# Patient Record
Sex: Female | Born: 1955 | Race: White | Hispanic: No | Smoking: Current every day smoker
Health system: Southern US, Community
[De-identification: ages and names within clinical notes are randomized; demographics above are authoritative.]

## PROBLEM LIST (undated history)

## (undated) DIAGNOSIS — J449 Chronic obstructive pulmonary disease, unspecified: Secondary | ICD-10-CM

## (undated) DIAGNOSIS — J45909 Unspecified asthma, uncomplicated: Secondary | ICD-10-CM

## (undated) DIAGNOSIS — R42 Dizziness and giddiness: Secondary | ICD-10-CM

## (undated) DIAGNOSIS — I10 Essential (primary) hypertension: Secondary | ICD-10-CM

## (undated) DIAGNOSIS — K219 Gastro-esophageal reflux disease without esophagitis: Secondary | ICD-10-CM

## (undated) DIAGNOSIS — F32A Depression, unspecified: Secondary | ICD-10-CM

## (undated) DIAGNOSIS — F419 Anxiety disorder, unspecified: Secondary | ICD-10-CM

## (undated) DIAGNOSIS — F329 Major depressive disorder, single episode, unspecified: Secondary | ICD-10-CM

## (undated) HISTORY — PX: FOOT SURGERY: SHX648

## (undated) HISTORY — PX: ABDOMINAL HYSTERECTOMY: SHX81

---

## 1998-07-17 ENCOUNTER — Inpatient Hospital Stay (HOSPITAL_COMMUNITY): Admission: AD | Admit: 1998-07-17 | Discharge: 1998-07-21 | Payer: Self-pay | Admitting: *Deleted

## 1999-06-05 ENCOUNTER — Inpatient Hospital Stay (HOSPITAL_COMMUNITY): Admission: EM | Admit: 1999-06-05 | Discharge: 1999-06-08 | Payer: Self-pay

## 2004-03-12 ENCOUNTER — Emergency Department (HOSPITAL_COMMUNITY): Admission: EM | Admit: 2004-03-12 | Discharge: 2004-03-12 | Payer: Self-pay | Admitting: Emergency Medicine

## 2004-09-12 ENCOUNTER — Ambulatory Visit: Payer: Self-pay | Admitting: Psychiatry

## 2004-09-12 ENCOUNTER — Ambulatory Visit: Payer: Self-pay | Admitting: Family Medicine

## 2004-09-19 ENCOUNTER — Ambulatory Visit: Payer: Self-pay | Admitting: Family Medicine

## 2004-09-27 ENCOUNTER — Ambulatory Visit: Payer: Self-pay | Admitting: Family Medicine

## 2004-10-28 ENCOUNTER — Ambulatory Visit: Payer: Self-pay | Admitting: Family Medicine

## 2005-01-22 ENCOUNTER — Emergency Department (HOSPITAL_COMMUNITY): Admission: EM | Admit: 2005-01-22 | Discharge: 2005-01-22 | Payer: Self-pay | Admitting: Emergency Medicine

## 2007-09-10 ENCOUNTER — Emergency Department (HOSPITAL_COMMUNITY): Admission: EM | Admit: 2007-09-10 | Discharge: 2007-09-10 | Payer: Self-pay | Admitting: Emergency Medicine

## 2008-04-21 ENCOUNTER — Ambulatory Visit (HOSPITAL_COMMUNITY): Admission: RE | Admit: 2008-04-21 | Discharge: 2008-04-21 | Payer: Self-pay | Admitting: Family Medicine

## 2010-09-13 ENCOUNTER — Other Ambulatory Visit (HOSPITAL_COMMUNITY): Payer: Self-pay | Admitting: Family Medicine

## 2010-09-13 DIAGNOSIS — Z139 Encounter for screening, unspecified: Secondary | ICD-10-CM

## 2010-09-15 ENCOUNTER — Ambulatory Visit (HOSPITAL_COMMUNITY)
Admission: RE | Admit: 2010-09-15 | Discharge: 2010-09-15 | Disposition: A | Discharge status: Home / Self Care | Payer: PRIVATE HEALTH INSURANCE | Source: Ambulatory Visit | Attending: Family Medicine | Admitting: Family Medicine

## 2010-09-15 DIAGNOSIS — Z139 Encounter for screening, unspecified: Secondary | ICD-10-CM

## 2010-09-15 DIAGNOSIS — Z1231 Encounter for screening mammogram for malignant neoplasm of breast: Secondary | ICD-10-CM | POA: Insufficient documentation

## 2011-02-28 LAB — URINE MICROSCOPIC-ADD ON

## 2011-02-28 LAB — URINALYSIS, ROUTINE W REFLEX MICROSCOPIC
Bilirubin Urine: NEGATIVE
Hgb urine dipstick: NEGATIVE
Ketones, ur: NEGATIVE
Protein, ur: NEGATIVE
Specific Gravity, Urine: 1.005
Urobilinogen, UA: 0.2

## 2011-02-28 LAB — GC/CHLAMYDIA PROBE AMP, GENITAL: Chlamydia, DNA Probe: NEGATIVE

## 2011-02-28 LAB — URINE CULTURE
Colony Count: NO GROWTH
Culture: NO GROWTH

## 2011-02-28 LAB — WET PREP, GENITAL: Yeast Wet Prep HPF POC: NONE SEEN

## 2011-05-10 ENCOUNTER — Ambulatory Visit (HOSPITAL_COMMUNITY): Admission: RE | Admit: 2011-05-10 | Payer: BC Managed Care – PPO | Source: Ambulatory Visit

## 2011-05-10 ENCOUNTER — Emergency Department (HOSPITAL_COMMUNITY)
Admission: EM | Admit: 2011-05-10 | Discharge: 2011-05-10 | Disposition: A | Discharge status: Home / Self Care | Payer: BC Managed Care – PPO | Attending: Emergency Medicine | Admitting: Emergency Medicine

## 2011-05-10 ENCOUNTER — Encounter: Payer: Self-pay | Admitting: *Deleted

## 2011-05-10 DIAGNOSIS — F17200 Nicotine dependence, unspecified, uncomplicated: Secondary | ICD-10-CM | POA: Insufficient documentation

## 2011-05-10 DIAGNOSIS — K219 Gastro-esophageal reflux disease without esophagitis: Secondary | ICD-10-CM | POA: Insufficient documentation

## 2011-05-10 DIAGNOSIS — I10 Essential (primary) hypertension: Secondary | ICD-10-CM | POA: Insufficient documentation

## 2011-05-10 DIAGNOSIS — J40 Bronchitis, not specified as acute or chronic: Secondary | ICD-10-CM | POA: Insufficient documentation

## 2011-05-10 HISTORY — DX: Gastro-esophageal reflux disease without esophagitis: K21.9

## 2011-05-10 HISTORY — DX: Essential (primary) hypertension: I10

## 2011-05-10 MED ORDER — AZITHROMYCIN 250 MG PO TABS: 250.0000 mg | ORAL_TABLET | Freq: Every day | ORAL | Status: AC

## 2011-05-10 MED ORDER — AZITHROMYCIN 250 MG PO TABS
500.0000 mg | ORAL_TABLET | Freq: Once | ORAL | Status: DC
  Filled 2011-05-10: qty 2

## 2011-05-10 NOTE — ED Notes (Signed)
MD at bedside to evaluate. Into room to evaluate. Resting sitting up in bed. States she had had cough\ congestion x 13 days. States it started out in sinuses and has moved down into lungs. Lung sounds clear in all fields with no respiratory difficulties. nonlabored breathing.

## 2011-05-10 NOTE — ED Notes (Signed)
Pt reports onset of productive cough and asso uri s&s starting 1 week ago

## 2011-05-10 NOTE — ED Provider Notes (Signed)
History     CSN: [id number] Arrival date & time: 05/10/2011  3:47 AM   First MD Initiated Contact with Patient 05/10/11 [phone]      Chief Complaint  Patient presents with  . Cough    (Consider location/radiation/quality/duration/timing/severity/associated sxs/prior treatment) Patient is a 55 y.o. female presenting with cough. The history is provided by the patient.  Cough This is a new problem. The current episode started more than 1 week ago. The problem occurs constantly. The problem has been gradually worsening. The cough is productive of sputum. There has been no fever. Associated symptoms include chills. Pertinent negatives include no chest pain, no sweats, no headaches and no shortness of breath. She has tried decongestants for the symptoms. She is a smoker.    Past Medical History  Diagnosis Date  . Hypertension   . Acid reflux     Past Surgical History  Procedure Date  . Abdominal hysterectomy     No family history on file.  History  Substance Use Topics  . Smoking status: Current Some Day Smoker  . Smokeless tobacco: Not on file  . Alcohol Use: No    OB History    Grav Para Term Preterm Abortions TAB SAB Ect Mult Living                  Review of Systems  Constitutional: Positive for chills.  Respiratory: Positive for cough. Negative for shortness of breath.   Cardiovascular: Negative for chest pain.  Neurological: Negative for headaches.  All other systems reviewed and are negative.    Allergies  Review of patient's allergies indicates no known allergies.  Home Medications  No current outpatient prescriptions on file.  BP 106/64  Pulse 78  Temp(Src) 97.9 F (36.6 C) (Oral)  Resp 20  Ht 5\' 3"  (1.6 m)  Wt 130 lb (58.968 kg)  BMI 23.03 kg/m2  SpO2 99%  Physical Exam  Nursing note and vitals reviewed. Constitutional: She is oriented to person, place, and time. She appears well-developed and well-nourished. No distress.  HENT:  Head:  Normocephalic and atraumatic.  Neck: Normal range of motion. Neck supple.  Cardiovascular: Normal rate and regular rhythm.  Exam reveals no gallop and no friction rub.   No murmur heard. Pulmonary/Chest: Effort normal and breath sounds normal. No respiratory distress. She has no wheezes.  Abdominal: Soft. Bowel sounds are normal. She exhibits no distension. There is no tenderness.  Musculoskeletal: Normal range of motion.  Neurological: She is alert and oriented to person, place, and time.  Skin: Skin is warm and dry. She is not diaphoretic.    ED Course  Procedures (including critical care time)  Labs Reviewed - No data to display No results found.   No diagnosis found.    MDM  Sick for two weeks.  Not getting better with otc meds.  Will give zithromax in addition to the albuterol inhaler she already has.        Geoffery Lyons, MD 05/10/11 [phone]

## 2012-01-25 ENCOUNTER — Emergency Department (HOSPITAL_COMMUNITY): Payer: BC Managed Care – PPO

## 2012-01-25 ENCOUNTER — Encounter (HOSPITAL_COMMUNITY): Payer: Self-pay

## 2012-01-25 ENCOUNTER — Emergency Department (HOSPITAL_COMMUNITY)
Admission: EM | Admit: 2012-01-25 | Discharge: 2012-01-25 | Disposition: A | Discharge status: Home / Self Care | Payer: BC Managed Care – PPO | Attending: Emergency Medicine | Admitting: Emergency Medicine

## 2012-01-25 DIAGNOSIS — F17200 Nicotine dependence, unspecified, uncomplicated: Secondary | ICD-10-CM | POA: Insufficient documentation

## 2012-01-25 DIAGNOSIS — I10 Essential (primary) hypertension: Secondary | ICD-10-CM | POA: Insufficient documentation

## 2012-01-25 DIAGNOSIS — K219 Gastro-esophageal reflux disease without esophagitis: Secondary | ICD-10-CM | POA: Insufficient documentation

## 2012-01-25 DIAGNOSIS — J0110 Acute frontal sinusitis, unspecified: Secondary | ICD-10-CM | POA: Insufficient documentation

## 2012-01-25 MED ORDER — CETIRIZINE-PSEUDOEPHEDRINE ER 5-120 MG PO TB12: 1.0000 | ORAL_TABLET | Freq: Two times a day (BID) | ORAL | Status: AC

## 2012-01-25 MED ORDER — AMOXICILLIN 500 MG PO CAPS: 500.0000 mg | ORAL_CAPSULE | Freq: Three times a day (TID) | ORAL | Status: AC

## 2012-01-25 MED ORDER — HYDROCODONE-ACETAMINOPHEN 5-325 MG PO TABS: 1.0000 | ORAL_TABLET | ORAL | Status: AC | PRN

## 2012-01-25 NOTE — ED Notes (Signed)
Complain of fever and congestion

## 2012-01-25 NOTE — ED Notes (Signed)
Patient began w/chest congestion Sunday progressing to sinus congestion and fullness in ears.  Reports Tmax at home this week was 101. Reports expectorating thick brown phlegm and green sinus drainage.

## 2012-01-25 NOTE — ED Notes (Signed)
Patient with no complaints at this time. Respirations even and unlabored. Skin warm/dry. Discharge instructions reviewed with patient at this time. Patient given opportunity to voice concerns/ask questions. Patient discharged at this time and left Emergency Department with steady gait.   

## 2012-01-30 NOTE — ED Provider Notes (Signed)
Medical screening examination/treatment/procedure(s) were performed by non-physician practitioner and as supervising physician I was immediately available for consultation/collaboration.   Rhylan Gross III, MD 01/30/12 1621 

## 2012-01-30 NOTE — ED Provider Notes (Signed)
History     CSN: [id number]  Arrival date & time 01/25/12  1139   First MD Initiated Contact with Patient 01/25/12 1220      Chief Complaint  Patient presents with  . Fever    (Consider location/radiation/quality/duration/timing/severity/associated sxs/prior treatment) HPI Comments: Penny Manning presents with a 4 days history of cough, nasal congestion and post nasal drip which has progessed to now include facial pain and pressure along with thick brown and green nasal congestion and post nasal drip.  She has had a fever to 101 at home which has responded to tylenol.  She has had a productive cough also,  But denies shortness of breath.  She has increasing fatigue and frontal headache.  She has found no alleviators for nasal congestion and cough.  She denies chest pain, sore throat, nausea and emesis.  The history is provided by the patient.    Past Medical History  Diagnosis Date  . Hypertension   . Acid reflux     Past Surgical History  Procedure Date  . Abdominal hysterectomy     No family history on file.  History  Substance Use Topics  . Smoking status: Current Some Day Smoker  . Smokeless tobacco: Not on file  . Alcohol Use: No    OB History    Grav Para Term Preterm Abortions TAB SAB Ect Mult Living                  Review of Systems  Constitutional: Negative for fever.  HENT: Positive for ear pain, congestion, rhinorrhea, postnasal drip and sinus pressure. Negative for sore throat, facial swelling, neck pain and neck stiffness.   Eyes: Negative.   Respiratory: Positive for cough. Negative for chest tightness and shortness of breath.   Cardiovascular: Negative for chest pain.  Gastrointestinal: Negative for nausea and abdominal pain.  Genitourinary: Negative.   Musculoskeletal: Negative for joint swelling and arthralgias.  Skin: Negative.  Negative for rash and wound.  Neurological: Positive for headaches. Negative for dizziness, weakness,  light-headedness and numbness.  Hematological: Negative.   Psychiatric/Behavioral: Negative.     Allergies  Review of patient's allergies indicates no known allergies.  Home Medications   Current Outpatient Rx  Name Route Sig Dispense Refill  . LISINOPRIL-HYDROCHLOROTHIAZIDE 20-12.5 MG PO TABS Oral Take 1 tablet by mouth daily.    Marland Kitchen OMEPRAZOLE 20 MG PO CPDR Oral Take 20 mg by mouth daily.    . AMOXICILLIN 500 MG PO CAPS Oral Take 1 capsule (500 mg total) by mouth 3 (three) times daily. 30 capsule 0  . CETIRIZINE-PSEUDOEPHEDRINE ER 5-120 MG PO TB12 Oral Take 1 tablet by mouth 2 (two) times daily. 20 tablet 0  . HYDROCODONE-ACETAMINOPHEN 5-325 MG PO TABS Oral Take 1 tablet by mouth every 4 (four) hours as needed for pain. 15 tablet 0    BP 150/97  Pulse 84  Temp 98.3 F (36.8 C) (Oral)  Resp 16  Ht 5\' 3"  (1.6 m)  Wt 130 lb (58.968 kg)  BMI 23.03 kg/m2  SpO2 95%  Physical Exam  Constitutional: She is oriented to person, place, and time. She appears well-developed and well-nourished.  HENT:  Head: Normocephalic and atraumatic.  Right Ear: Tympanic membrane and ear canal normal.  Left Ear: Tympanic membrane and ear canal normal.  Nose: Mucosal edema and rhinorrhea present. Left sinus exhibits frontal sinus tenderness.  Mouth/Throat: Uvula is midline, oropharynx is clear and moist and mucous membranes are normal. No oropharyngeal exudate, posterior  oropharyngeal edema, posterior oropharyngeal erythema or tonsillar abscesses.  Eyes: Conjunctivae are normal.  Pulmonary/Chest: Effort normal. No respiratory distress. She has no wheezes. She has rhonchi in the right lower field and the left lower field. She has no rales.  Abdominal: Soft. There is no tenderness.  Musculoskeletal: Normal range of motion.  Neurological: She is alert and oriented to person, place, and time.  Skin: Skin is warm and dry. No rash noted.  Psychiatric: She has a normal mood and affect.    ED Course    Procedures (including critical care time)  Labs Reviewed - No data to display No results found.   1. Sinusitis, acute frontal       MDM  CXR reviewed and clear.  Pt's sinusitis tx with amoxil,  Also prescribed zyrtec d and hydrocodone for facial pain and for cough suppression.  Encouraged recheck by pcp in 1 week,  Sooner for worsened sx.        Burgess Amor, PA 01/30/12 1248

## 2013-03-07 ENCOUNTER — Other Ambulatory Visit (HOSPITAL_COMMUNITY): Payer: Self-pay | Admitting: Internal Medicine

## 2013-03-07 ENCOUNTER — Ambulatory Visit (HOSPITAL_COMMUNITY)
Admission: RE | Admit: 2013-03-07 | Discharge: 2013-03-07 | Disposition: A | Discharge status: Home / Self Care | Payer: BC Managed Care – PPO | Source: Ambulatory Visit | Attending: Internal Medicine | Admitting: Internal Medicine

## 2013-03-07 DIAGNOSIS — R059 Cough, unspecified: Secondary | ICD-10-CM | POA: Insufficient documentation

## 2013-03-07 DIAGNOSIS — R05 Cough: Secondary | ICD-10-CM

## 2015-02-17 ENCOUNTER — Emergency Department (HOSPITAL_COMMUNITY)
Admission: EM | Admit: 2015-02-17 | Discharge: 2015-02-17 | Disposition: A | Discharge status: Home / Self Care | Payer: Worker's Compensation | Attending: Emergency Medicine | Admitting: Emergency Medicine

## 2015-02-17 ENCOUNTER — Encounter (HOSPITAL_COMMUNITY): Payer: Self-pay | Admitting: *Deleted

## 2015-02-17 DIAGNOSIS — Y93E5 Activity, floor mopping and cleaning: Secondary | ICD-10-CM | POA: Insufficient documentation

## 2015-02-17 DIAGNOSIS — Y9289 Other specified places as the place of occurrence of the external cause: Secondary | ICD-10-CM | POA: Insufficient documentation

## 2015-02-17 DIAGNOSIS — S61213A Laceration without foreign body of left middle finger without damage to nail, initial encounter: Secondary | ICD-10-CM | POA: Insufficient documentation

## 2015-02-17 DIAGNOSIS — F419 Anxiety disorder, unspecified: Secondary | ICD-10-CM | POA: Diagnosis not present

## 2015-02-17 DIAGNOSIS — S61412A Laceration without foreign body of left hand, initial encounter: Secondary | ICD-10-CM | POA: Diagnosis not present

## 2015-02-17 DIAGNOSIS — Z79899 Other long term (current) drug therapy: Secondary | ICD-10-CM | POA: Insufficient documentation

## 2015-02-17 DIAGNOSIS — I10 Essential (primary) hypertension: Secondary | ICD-10-CM | POA: Diagnosis not present

## 2015-02-17 DIAGNOSIS — Z7951 Long term (current) use of inhaled steroids: Secondary | ICD-10-CM | POA: Insufficient documentation

## 2015-02-17 DIAGNOSIS — K219 Gastro-esophageal reflux disease without esophagitis: Secondary | ICD-10-CM | POA: Insufficient documentation

## 2015-02-17 DIAGNOSIS — J449 Chronic obstructive pulmonary disease, unspecified: Secondary | ICD-10-CM | POA: Diagnosis not present

## 2015-02-17 DIAGNOSIS — Z23 Encounter for immunization: Secondary | ICD-10-CM | POA: Diagnosis not present

## 2015-02-17 DIAGNOSIS — Y998 Other external cause status: Secondary | ICD-10-CM | POA: Insufficient documentation

## 2015-02-17 DIAGNOSIS — Z87891 Personal history of nicotine dependence: Secondary | ICD-10-CM | POA: Diagnosis not present

## 2015-02-17 DIAGNOSIS — W25XXXA Contact with sharp glass, initial encounter: Secondary | ICD-10-CM | POA: Diagnosis not present

## 2015-02-17 HISTORY — DX: Chronic obstructive pulmonary disease, unspecified: J44.9

## 2015-02-17 HISTORY — DX: Anxiety disorder, unspecified: F41.9

## 2015-02-17 MED ORDER — TETANUS-DIPHTH-ACELL PERTUSSIS 5-2.5-18.5 LF-MCG/0.5 IM SUSP
0.5000 mL | Freq: Once | INTRAMUSCULAR | Status: AC
  Administered 2015-02-17: 0.5 mL via INTRAMUSCULAR
  Filled 2015-02-17: qty 0.5

## 2015-02-17 MED ORDER — CEPHALEXIN 500 MG PO CAPS: 500.0000 mg | ORAL_CAPSULE | Freq: Four times a day (QID) | ORAL | Status: DC

## 2015-02-17 MED ORDER — CEPHALEXIN 500 MG PO CAPS
500.0000 mg | ORAL_CAPSULE | Freq: Once | ORAL | Status: AC
  Administered 2015-02-17: 500 mg via ORAL
  Filled 2015-02-17: qty 1

## 2015-02-17 MED ORDER — POVIDONE-IODINE 10 % EX SOLN
CUTANEOUS | Status: AC
  Administered 2015-02-17: 23:00:00
  Filled 2015-02-17: qty 118

## 2015-02-17 MED ORDER — LIDOCAINE HCL (PF) 1 % IJ SOLN
INTRAMUSCULAR | Status: AC
  Administered 2015-02-17
  Filled 2015-02-17: qty 5

## 2015-02-17 NOTE — ED Notes (Signed)
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

## 2015-02-17 NOTE — ED Provider Notes (Signed)
CSN: [id number]     Arrival date & time 02/17/15  2129 History   First MD Initiated Contact with Patient 02/17/15 2207     Chief Complaint  Patient presents with  . Extremity Laceration     (Consider location/radiation/quality/duration/timing/severity/associated sxs/prior Treatment) HPI   Penny Manning is a 59 y.o. female who presents to the Emergency Department complaining of lacerations to her left hand that occurred this evening.  This is a work related injury.  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  Less than one hour prior to ED arrival, she states she cut her left middle finger performing the same job. She complains of "numb tingling feeling" to thumb side of the finger.  Last Td is unknown.  She denies swelling, difficulty bending or flexing the finger, possible foreign bodies or use of anticoagulants.      Past Medical History  Diagnosis Date  . Hypertension   . Acid reflux   . COPD (chronic obstructive pulmonary disease)   . Anxiety    Past Surgical History  Procedure Laterality Date  . Abdominal hysterectomy     History reviewed. No pertinent family history. Social History  Substance Use Topics  . Smoking status: Former Smoker    Quit date: 02/10/2015  . Smokeless tobacco: None  . Alcohol Use: No   OB History    No data available     Review of Systems  Constitutional: Negative for fever and chills.  Musculoskeletal: Negative for back pain, joint swelling and arthralgias.  Skin: Positive for wound.       Laceration left palm and middle finger  Neurological: Negative for dizziness, weakness and numbness.  Hematological: Does not bruise/bleed easily.  All other systems reviewed and are negative.     Allergies  Review of patient's allergies indicates no known allergies.  Home Medications   Prior to Admission  medications   Medication Sig Start Date End Date Taking? Authorizing Provider  ALPRAZolam Prudy Feeler) 0.5 MG tablet Take 1 tablet by mouth 2 (two) times daily as needed for anxiety.  01/21/15  Yes Historical Provider, MD  lisinopril-hydrochlorothiazide (PRINZIDE,ZESTORETIC) 20-12.5 MG per tablet Take 1 tablet by mouth daily.   Yes Historical Provider, MD  meclizine (ANTIVERT) 25 MG tablet Take 1 tablet by mouth 4 (four) times daily as needed for dizziness.  02/12/15  Yes Historical Provider, MD  Multiple Vitamins-Minerals (AIRBORNE PO) Take by mouth daily.   Yes Historical Provider, MD  naproxen sodium (ALEVE) 220 MG tablet Take 220 mg by mouth daily as needed (for pain).   Yes Historical Provider, MD  omeprazole (PRILOSEC) 20 MG capsule Take 20 mg by mouth daily.   Yes Historical Provider, MD  pantoprazole (PROTONIX) 40 MG tablet Take 1 tablet by mouth 2 (two) times daily. 01/11/15  Yes Historical Provider, MD  PROAIR HFA 108 (90 BASE) MCG/ACT inhaler Inhale 1-2 puffs into the lungs every 6 (six) hours as needed for wheezing.  01/21/15  Yes Historical Provider, MD  sertraline (ZOLOFT) 100 MG tablet Take 100 mg by mouth every evening. 01/11/15  Yes Historical Provider, MD  SYMBICORT 160-4.5 MCG/ACT inhaler Inhale 2 puffs into the lungs 2 (two) times daily. 01/10/15  Yes Historical Provider, MD  zolpidem (AMBIEN) 10 MG tablet Take 10 mg by mouth at bedtime. 02/02/15  Yes Historical Provider, MD  BP 138/85 mmHg  Pulse 67  Temp(Src) 98.2 F (36.8 C) (Oral)  Resp 18  Ht  (1.6 m)  Wt 128 lb (58.06 kg)  BMI 22.68 kg/m2  SpO2 95% Physical Exam  Constitutional: She is oriented to person, place, and time. She appears well-developed and well-nourished. No distress.  HENT:  Head: Normocephalic and atraumatic.  Cardiovascular: Normal rate, regular rhythm, normal heart sounds and intact distal pulses.   No murmur heard. Pulmonary/Chest: Effort normal and breath sounds normal. No respiratory distress.    Musculoskeletal: She exhibits no edema or tenderness.       Left hand: She exhibits laceration. She exhibits normal range of motion, no bony tenderness, normal two-point discrimination, normal capillary refill, no deformity and no swelling. Normal strength noted. She exhibits no finger abduction and no thumb/finger opposition.       Hands: 2 cm lac the thumb side of the left middle finger.  1 cm lac to the mid palm.  Bleeding controlled, no edema.  Pt has full ROM of the finger.  CR< 2 sec.  Distal sensation intact.  Wound explored, no FB's or obvious injuries of the deep structures.    Neurological: She is alert and oriented to person, place, and time. She exhibits normal muscle tone. Coordination normal.  Skin: Skin is warm. No laceration noted.  Nursing note and vitals reviewed.   ED Course  Procedures (including critical care time) Labs Review Labs Reviewed - No data to display  Imaging Review No results found. I have personally reviewed and evaluated these images and lab results as part of my medical decision-making.   EKG Interpretation None       LACERATION REPAIR #1 Performed by: Simora Dingee L. Authorized by: Maxwell Caul Consent: Verbal consent obtained. Risks and benefits: risks, benefits and alternatives were discussed Consent given by: patient Patient identity confirmed: provided demographic data Prepped and Draped in normal sterile fashion Wound explored  Laceration Location: proximal left middle finger  Laceration Length: 2 cm  No Foreign Bodies seen or palpated  Anesthesia: local infiltration  Local anesthetic: lidocaine 1% w/o epinephrine  Anesthetic total: 2 ml  Irrigation method: syringe Amount of cleaning: standard  Skin closure: 4-0 ethilon  Number of sutures: 4  Technique: simple interrupted   LACERATION REPAIR #2  Performed by: Parth Mccormac L. Authorized by: Maxwell Caul Consent: Verbal consent obtained. Risks and  benefits: risks, benefits and alternatives were discussed Consent given by: patient Patient identity confirmed: provided demographic data Prepped and Draped in normal sterile fashion Wound explored  Laceration Location: left palm  Laceration Length: 1 cm  No Foreign Bodies seen or palpated  Anesthesia: local infiltration  Local anesthetic: lidocaine 1 % w/o epinephrine  Anesthetic total: 0.5 ml  Irrigation method: syringe Amount of cleaning: standard  Skin closure: 4-0 ethilon  Number of sutures: 2  Technique: simple interrupted  Patient tolerance: Patient tolerated the procedure well with no immediate complications.     MDM   Final diagnoses:  Laceration of left hand, initial encounter  Laceration of left middle finger w/o foreign body w/o damage to nail, initial encounter    Wounds were cleaned and bandaged, finger splinted.  Td updated.  Remains NV intact.  She complains of a numb tingling sensation to the middle finger, but gross sensation and ROM are intact.  I will prescribe keflex and have pt return here in 2 days for recheck or sooner if needed.  She was given proper wound care instructions and agrees  to plan.  Vitals stable.  Appears stable for d/c    Pauline Aus, PA-C 02/17/15 2336  Eber Hong, MD 02/18/15 2156

## 2015-02-17 NOTE — ED Notes (Signed)
Patient verbalizes understanding of discharge instructions, prescription medications, home care, and follow up care. Discharged and escorted to lab for workmans comp drug screen, family escorted patient.

## 2015-02-17 NOTE — Discharge Instructions (Signed)

## 2015-02-19 ENCOUNTER — Encounter (HOSPITAL_COMMUNITY): Payer: Self-pay | Admitting: Emergency Medicine

## 2015-02-19 ENCOUNTER — Emergency Department (HOSPITAL_COMMUNITY)
Admission: EM | Admit: 2015-02-19 | Discharge: 2015-02-19 | Disposition: A | Discharge status: Home / Self Care | Payer: Worker's Compensation | Attending: Emergency Medicine | Admitting: Emergency Medicine

## 2015-02-19 DIAGNOSIS — I10 Essential (primary) hypertension: Secondary | ICD-10-CM | POA: Insufficient documentation

## 2015-02-19 DIAGNOSIS — R200 Anesthesia of skin: Secondary | ICD-10-CM | POA: Diagnosis not present

## 2015-02-19 DIAGNOSIS — J449 Chronic obstructive pulmonary disease, unspecified: Secondary | ICD-10-CM | POA: Insufficient documentation

## 2015-02-19 DIAGNOSIS — K219 Gastro-esophageal reflux disease without esophagitis: Secondary | ICD-10-CM | POA: Diagnosis not present

## 2015-02-19 DIAGNOSIS — G629 Polyneuropathy, unspecified: Secondary | ICD-10-CM | POA: Diagnosis not present

## 2015-02-19 DIAGNOSIS — Z79899 Other long term (current) drug therapy: Secondary | ICD-10-CM | POA: Insufficient documentation

## 2015-02-19 DIAGNOSIS — F419 Anxiety disorder, unspecified: Secondary | ICD-10-CM | POA: Insufficient documentation

## 2015-02-19 DIAGNOSIS — Z7951 Long term (current) use of inhaled steroids: Secondary | ICD-10-CM | POA: Diagnosis not present

## 2015-02-19 DIAGNOSIS — Z87891 Personal history of nicotine dependence: Secondary | ICD-10-CM | POA: Insufficient documentation

## 2015-02-19 DIAGNOSIS — Z4801 Encounter for change or removal of surgical wound dressing: Secondary | ICD-10-CM | POA: Diagnosis not present

## 2015-02-19 DIAGNOSIS — IMO0002 Reserved for concepts with insufficient information to code with codable children: Secondary | ICD-10-CM

## 2015-02-19 NOTE — ED Notes (Signed)
Applied nonadherent telfa to suture sites and secured in place with paper tape. Pt tolerated well. nad noted.

## 2015-02-19 NOTE — Discharge Instructions (Signed)
As discussed,  The numbness in your finger may resolve over time, but can take several months.  Sometimes it does not improve, but it is encouraging that you increased sensation from just 2 days ago.

## 2015-02-19 NOTE — ED Notes (Signed)
Pt reports was told to come back for recheck of sutures. Pt reports had tetanus shot and sutures placed on 02/17/15.

## 2015-02-22 NOTE — ED Provider Notes (Signed)
CSN: [id number]     Arrival date & time 02/19/15  1117 History   First MD Initiated Contact with Patient 02/19/15 1256     Chief Complaint  Patient presents with  . Wound Check     (Consider location/radiation/quality/duration/timing/severity/associated sxs/prior Treatment) The history is provided by the patient.   Penny Manning is a 59 y.o. female presenting for laceration recheck.  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  She reported a numb feeling to the thumb side of her long finger distal to the laceration which is slightly better today, but still does not feel back to normal.  She has movement of the finger and can feel the distal tip and ulnar side.  She denies any problems with the sutured wound including swelling, drainage or worsening pain.     Past Medical History  Diagnosis Date  . Hypertension   . Acid reflux   . COPD (chronic obstructive pulmonary disease)   . Anxiety    Past Surgical History  Procedure Laterality Date  . Abdominal hysterectomy     History reviewed. No pertinent family history. Social History  Substance Use Topics  . Smoking status: Former Smoker    Quit date: 02/10/2015  . Smokeless tobacco: None  . Alcohol Use: No   OB History    No data available     Review of Systems  Constitutional: Negative for fever and chills.  Respiratory: Negative for shortness of breath and wheezing.   Skin: Positive for wound. Negative for color change.  Neurological: Positive for numbness.      Allergies  Review of patient's allergies indicates no known allergies.  Home Medications   Prior to Admission medications   Medication Sig Start Date End Date Taking? Authorizing Provider  ALPRAZolam Prudy Feeler) 0.5 MG tablet Take 1 tablet by mouth 2 (two) times daily as needed for anxiety.  01/21/15   Historical Provider, MD  cephALEXin (KEFLEX) 500 MG capsule Take 1 capsule (500 mg total) by mouth 4  (four) times daily. For 7 days 02/17/15   Tammy Triplett, PA-C  lisinopril-hydrochlorothiazide (PRINZIDE,ZESTORETIC) 20-12.5 MG per tablet Take 1 tablet by mouth daily.    Historical Provider, MD  meclizine (ANTIVERT) 25 MG tablet Take 1 tablet by mouth 4 (four) times daily as needed for dizziness.  02/12/15   Historical Provider, MD  Multiple Vitamins-Minerals (AIRBORNE PO) Take by mouth daily.    Historical Provider, MD  naproxen sodium (ALEVE) 220 MG tablet Take 220 mg by mouth daily as needed (for pain).    Historical Provider, MD  omeprazole (PRILOSEC) 20 MG capsule Take 20 mg by mouth daily.    Historical Provider, MD  pantoprazole (PROTONIX) 40 MG tablet Take 1 tablet by mouth 2 (two) times daily. 01/11/15   Historical Provider, MD  PROAIR HFA 108 (90 BASE) MCG/ACT inhaler Inhale 1-2 puffs into the lungs every 6 (six) hours as needed for wheezing.  01/21/15   Historical Provider, MD  sertraline (ZOLOFT) 100 MG tablet Take 100 mg by mouth every evening. 01/11/15   Historical Provider, MD  SYMBICORT 160-4.5 MCG/ACT inhaler Inhale 2 puffs into the lungs 2 (two) times daily. 01/10/15   Historical Provider, MD  zolpidem (AMBIEN) 10 MG tablet Take 10 mg by mouth at bedtime. 02/02/15   Historical Provider, MD   BP 123/71 mmHg  Pulse 55  Temp(Src) 98.1 F (36.7 C) (Oral)  Resp 18  Ht  (  1.6 m)  Wt 125 lb (56.7 kg)  BMI 22.15 kg/m2  SpO2 97% Physical Exam  Constitutional: She is oriented to person, place, and time. She appears well-developed and well-nourished.  HENT:  Head: Normocephalic.  Cardiovascular: Normal rate.   Pulmonary/Chest: Effort normal.  Musculoskeletal: She exhibits tenderness.  Neurological: She is alert and oriented to person, place, and time. No sensory deficit.  Skin: Laceration noted.  Well appearing lacerations left long finger and smaller wound palm.  Edges approximated, no drainage, swelling or erythema.  Distal sensation thumb side of long finger reduced in comparison  to the ulnar side, but present.  Distal cap refill less than 2 seconds.    ED Course  Procedures (including critical care time) Labs Review Labs Reviewed - No data to display  Imaging Review No results found. I have personally reviewed and evaluated these images and lab results as part of my medical decision-making.   EKG Interpretation None      MDM   Final diagnoses:  Laceration  Neuropathy    Advised patient that since she has noticed improvement in just 2 days, encouraging that her sensation may return completely but can take months for this to occur.  Also there is no guarantee this will be the case as it can continue to improve or not.  Pt referred to a hand specialist for further evaluation and tx if she decides she would like to consider surgical intervention to increase the likelihood of complete resolution of this neuropathy.    Burgess Amor, PA-C 02/22/15 1203  Rolland Porter, MD 02/24/15 [phone]

## 2015-02-24 ENCOUNTER — Encounter (HOSPITAL_COMMUNITY)
Admission: RE | Admit: 2015-02-24 | Discharge: 2015-02-24 | Disposition: A | Discharge status: Home / Self Care | Payer: Worker's Compensation | Source: Ambulatory Visit | Attending: Orthopedic Surgery | Admitting: Orthopedic Surgery

## 2015-02-24 ENCOUNTER — Encounter (HOSPITAL_BASED_OUTPATIENT_CLINIC_OR_DEPARTMENT_OTHER): Payer: Self-pay | Admitting: *Deleted

## 2015-02-24 ENCOUNTER — Other Ambulatory Visit: Payer: Self-pay | Admitting: Orthopedic Surgery

## 2015-02-24 DIAGNOSIS — F17210 Nicotine dependence, cigarettes, uncomplicated: Secondary | ICD-10-CM | POA: Diagnosis not present

## 2015-02-24 DIAGNOSIS — I10 Essential (primary) hypertension: Secondary | ICD-10-CM | POA: Diagnosis not present

## 2015-02-24 DIAGNOSIS — J449 Chronic obstructive pulmonary disease, unspecified: Secondary | ICD-10-CM | POA: Diagnosis not present

## 2015-02-24 DIAGNOSIS — S64493A Injury of digital nerve of left middle finger, initial encounter: Secondary | ICD-10-CM | POA: Diagnosis not present

## 2015-02-24 DIAGNOSIS — J45909 Unspecified asthma, uncomplicated: Secondary | ICD-10-CM | POA: Diagnosis not present

## 2015-02-24 DIAGNOSIS — Y9269 Other specified industrial and construction area as the place of occurrence of the external cause: Secondary | ICD-10-CM | POA: Diagnosis not present

## 2015-02-24 DIAGNOSIS — S6402XA Injury of ulnar nerve at wrist and hand level of left arm, initial encounter: Secondary | ICD-10-CM | POA: Diagnosis not present

## 2015-02-24 DIAGNOSIS — F329 Major depressive disorder, single episode, unspecified: Secondary | ICD-10-CM | POA: Diagnosis not present

## 2015-02-24 DIAGNOSIS — S61412A Laceration without foreign body of left hand, initial encounter: Secondary | ICD-10-CM | POA: Diagnosis present

## 2015-02-24 DIAGNOSIS — Z90710 Acquired absence of both cervix and uterus: Secondary | ICD-10-CM | POA: Diagnosis not present

## 2015-02-24 DIAGNOSIS — F419 Anxiety disorder, unspecified: Secondary | ICD-10-CM | POA: Diagnosis not present

## 2015-02-24 DIAGNOSIS — S65012A Laceration of ulnar artery at wrist and hand level of left arm, initial encounter: Secondary | ICD-10-CM | POA: Diagnosis not present

## 2015-02-24 DIAGNOSIS — Y9389 Activity, other specified: Secondary | ICD-10-CM | POA: Diagnosis not present

## 2015-02-24 DIAGNOSIS — Y990 Civilian activity done for income or pay: Secondary | ICD-10-CM | POA: Diagnosis not present

## 2015-02-24 DIAGNOSIS — W25XXXA Contact with sharp glass, initial encounter: Secondary | ICD-10-CM | POA: Diagnosis not present

## 2015-02-24 DIAGNOSIS — S66127A Laceration of flexor muscle, fascia and tendon of left little finger at wrist and hand level, initial encounter: Secondary | ICD-10-CM | POA: Diagnosis not present

## 2015-02-24 DIAGNOSIS — K219 Gastro-esophageal reflux disease without esophagitis: Secondary | ICD-10-CM | POA: Diagnosis not present

## 2015-02-24 LAB — BASIC METABOLIC PANEL
ANION GAP: 6 (ref 5–15)
BUN: 29 mg/dL — AB (ref 6–20)
CHLORIDE: 102 mmol/L (ref 101–111)
CO2: 28 mmol/L (ref 22–32)
Calcium: 8.8 mg/dL — ABNORMAL LOW (ref 8.9–10.3)
Creatinine, Ser: 0.96 mg/dL (ref 0.44–1.00)
GFR calc Af Amer: >60 mL/min (ref >60)
GFR calc non Af Amer: >60 mL/min (ref >60)
GLUCOSE: 98 mg/dL (ref 65–99)
POTASSIUM: 3.8 mmol/L (ref 3.5–5.1)
Sodium: 136 mmol/L (ref 135–145)

## 2015-02-25 ENCOUNTER — Encounter (HOSPITAL_BASED_OUTPATIENT_CLINIC_OR_DEPARTMENT_OTHER)
Admission: RE | Disposition: A | Discharge status: Home / Self Care | Payer: Self-pay | Source: Ambulatory Visit | Attending: Orthopedic Surgery

## 2015-02-25 ENCOUNTER — Ambulatory Visit (HOSPITAL_BASED_OUTPATIENT_CLINIC_OR_DEPARTMENT_OTHER): Payer: Worker's Compensation | Admitting: Anesthesiology

## 2015-02-25 ENCOUNTER — Ambulatory Visit (HOSPITAL_BASED_OUTPATIENT_CLINIC_OR_DEPARTMENT_OTHER)
Admission: RE | Admit: 2015-02-25 | Discharge: 2015-02-25 | Disposition: A | Discharge status: Home / Self Care | Payer: Worker's Compensation | Source: Ambulatory Visit | Attending: Orthopedic Surgery | Admitting: Orthopedic Surgery

## 2015-02-25 ENCOUNTER — Encounter (HOSPITAL_BASED_OUTPATIENT_CLINIC_OR_DEPARTMENT_OTHER): Payer: Self-pay | Admitting: *Deleted

## 2015-02-25 DIAGNOSIS — I10 Essential (primary) hypertension: Secondary | ICD-10-CM | POA: Insufficient documentation

## 2015-02-25 DIAGNOSIS — S64493A Injury of digital nerve of left middle finger, initial encounter: Secondary | ICD-10-CM | POA: Diagnosis not present

## 2015-02-25 DIAGNOSIS — F329 Major depressive disorder, single episode, unspecified: Secondary | ICD-10-CM | POA: Insufficient documentation

## 2015-02-25 DIAGNOSIS — J45909 Unspecified asthma, uncomplicated: Secondary | ICD-10-CM | POA: Insufficient documentation

## 2015-02-25 DIAGNOSIS — F17210 Nicotine dependence, cigarettes, uncomplicated: Secondary | ICD-10-CM | POA: Insufficient documentation

## 2015-02-25 DIAGNOSIS — S66127A Laceration of flexor muscle, fascia and tendon of left little finger at wrist and hand level, initial encounter: Secondary | ICD-10-CM | POA: Insufficient documentation

## 2015-02-25 DIAGNOSIS — Y990 Civilian activity done for income or pay: Secondary | ICD-10-CM | POA: Insufficient documentation

## 2015-02-25 DIAGNOSIS — S65012A Laceration of ulnar artery at wrist and hand level of left arm, initial encounter: Secondary | ICD-10-CM | POA: Insufficient documentation

## 2015-02-25 DIAGNOSIS — W25XXXA Contact with sharp glass, initial encounter: Secondary | ICD-10-CM | POA: Insufficient documentation

## 2015-02-25 DIAGNOSIS — J449 Chronic obstructive pulmonary disease, unspecified: Secondary | ICD-10-CM | POA: Insufficient documentation

## 2015-02-25 DIAGNOSIS — S61412A Laceration without foreign body of left hand, initial encounter: Secondary | ICD-10-CM | POA: Diagnosis not present

## 2015-02-25 DIAGNOSIS — K219 Gastro-esophageal reflux disease without esophagitis: Secondary | ICD-10-CM | POA: Insufficient documentation

## 2015-02-25 DIAGNOSIS — S6402XA Injury of ulnar nerve at wrist and hand level of left arm, initial encounter: Secondary | ICD-10-CM | POA: Diagnosis not present

## 2015-02-25 DIAGNOSIS — F419 Anxiety disorder, unspecified: Secondary | ICD-10-CM | POA: Insufficient documentation

## 2015-02-25 DIAGNOSIS — Y9389 Activity, other specified: Secondary | ICD-10-CM | POA: Insufficient documentation

## 2015-02-25 DIAGNOSIS — Y9269 Other specified industrial and construction area as the place of occurrence of the external cause: Secondary | ICD-10-CM | POA: Insufficient documentation

## 2015-02-25 DIAGNOSIS — Z90710 Acquired absence of both cervix and uterus: Secondary | ICD-10-CM | POA: Insufficient documentation

## 2015-02-25 HISTORY — DX: Unspecified asthma, uncomplicated: J45.909

## 2015-02-25 HISTORY — DX: Depression, unspecified: F32.A

## 2015-02-25 HISTORY — DX: Dizziness and giddiness: R42

## 2015-02-25 HISTORY — PX: NERVE, TENDON AND ARTERY REPAIR: SHX5695

## 2015-02-25 HISTORY — DX: Major depressive disorder, single episode, unspecified: F32.9

## 2015-02-25 SURGERY — NERVE, TENDON AND ARTERY REPAIR
Anesthesia: General | Site: Hand | Laterality: Left

## 2015-02-25 MED ORDER — HEPARIN SODIUM (PORCINE) 1000 UNIT/ML IJ SOLN
INTRAMUSCULAR | Status: AC
Start: 2015-02-25 — End: 2015-02-25
  Filled 2015-02-25: qty 1

## 2015-02-25 MED ORDER — FENTANYL CITRATE (PF) 100 MCG/2ML IJ SOLN
INTRAMUSCULAR | Status: AC
  Filled 2015-02-25: qty 4

## 2015-02-25 MED ORDER — OXYCODONE HCL 5 MG/5ML PO SOLN: 5.0000 mg | Freq: Once | ORAL | Status: DC | PRN

## 2015-02-25 MED ORDER — OXYCODONE HCL 5 MG PO TABS: 5.0000 mg | ORAL_TABLET | Freq: Once | ORAL | Status: DC | PRN

## 2015-02-25 MED ORDER — OXYCODONE-ACETAMINOPHEN 5-325 MG PO TABS: ORAL_TABLET | ORAL | Status: DC

## 2015-02-25 MED ORDER — ONDANSETRON HCL 4 MG/2ML IJ SOLN
INTRAMUSCULAR | Status: DC | PRN
  Administered 2015-02-25: 4 mg via INTRAVENOUS

## 2015-02-25 MED ORDER — FENTANYL CITRATE (PF) 100 MCG/2ML IJ SOLN
INTRAMUSCULAR | Status: AC
  Filled 2015-02-25: qty 2

## 2015-02-25 MED ORDER — GLYCOPYRROLATE 0.2 MG/ML IJ SOLN: 0.2000 mg | Freq: Once | INTRAMUSCULAR | Status: DC | PRN

## 2015-02-25 MED ORDER — MIDAZOLAM HCL 2 MG/2ML IJ SOLN
1.0000 mg | INTRAMUSCULAR | Status: DC | PRN
  Administered 2015-02-25: 2 mg via INTRAVENOUS

## 2015-02-25 MED ORDER — FENTANYL CITRATE (PF) 100 MCG/2ML IJ SOLN
50.0000 ug | INTRAMUSCULAR | Status: DC | PRN
  Administered 2015-02-25: 100 ug via INTRAVENOUS

## 2015-02-25 MED ORDER — LACTATED RINGERS IV SOLN
INTRAVENOUS | Status: DC
  Administered 2015-02-25 (×2): via INTRAVENOUS

## 2015-02-25 MED ORDER — PROPOFOL 10 MG/ML IV BOLUS
INTRAVENOUS | Status: DC | PRN
  Administered 2015-02-25: 200 mg via INTRAVENOUS

## 2015-02-25 MED ORDER — MIDAZOLAM HCL 2 MG/2ML IJ SOLN
INTRAMUSCULAR | Status: AC
  Filled 2015-02-25: qty 4

## 2015-02-25 MED ORDER — EPHEDRINE SULFATE 50 MG/ML IJ SOLN
INTRAMUSCULAR | Status: DC | PRN
  Administered 2015-02-25: 15 mg via INTRAVENOUS

## 2015-02-25 MED ORDER — CEFAZOLIN SODIUM-DEXTROSE 2-3 GM-% IV SOLR
2.0000 g | INTRAVENOUS | Status: AC
Start: 2015-02-26 — End: 2015-02-25
  Administered 2015-02-25: 2 g via INTRAVENOUS

## 2015-02-25 MED ORDER — SCOPOLAMINE 1 MG/3DAYS TD PT72: 1.0000 | MEDICATED_PATCH | Freq: Once | TRANSDERMAL | Status: DC | PRN

## 2015-02-25 MED ORDER — CEFAZOLIN SODIUM-DEXTROSE 2-3 GM-% IV SOLR
INTRAVENOUS | Status: AC
  Filled 2015-02-25: qty 50

## 2015-02-25 MED ORDER — CHLORHEXIDINE GLUCONATE 4 % EX LIQD: 60.0000 mL | Freq: Once | CUTANEOUS | Status: DC

## 2015-02-25 MED ORDER — BUPIVACAINE HCL (PF) 0.25 % IJ SOLN
INTRAMUSCULAR | Status: DC | PRN
  Administered 2015-02-25: 10 mL

## 2015-02-25 MED ORDER — LIDOCAINE HCL (PF) 1 % IJ SOLN
INTRAMUSCULAR | Status: AC
  Filled 2015-02-25: qty 30

## 2015-02-25 MED ORDER — PROMETHAZINE HCL 25 MG/ML IJ SOLN: 6.2500 mg | INTRAMUSCULAR | Status: DC | PRN

## 2015-02-25 MED ORDER — FENTANYL CITRATE (PF) 100 MCG/2ML IJ SOLN
25.0000 ug | INTRAMUSCULAR | Status: DC | PRN
  Administered 2015-02-25: 50 ug via INTRAVENOUS
  Administered 2015-02-25: 25 ug via INTRAVENOUS

## 2015-02-25 MED ORDER — LIDOCAINE HCL (PF) 1 % IJ SOLN
INTRAVENOUS | Status: DC | PRN
  Administered 2015-02-25: 2 mL

## 2015-02-25 MED ORDER — DEXAMETHASONE SODIUM PHOSPHATE 4 MG/ML IJ SOLN
INTRAMUSCULAR | Status: DC | PRN
  Administered 2015-02-25: 10 mg via INTRAVENOUS

## 2015-02-25 MED ORDER — LIDOCAINE HCL (CARDIAC) 20 MG/ML IV SOLN
INTRAVENOUS | Status: DC | PRN
  Administered 2015-02-25: 50 mg via INTRAVENOUS

## 2015-02-25 SURGICAL SUPPLY — 67 items
BAG DECANTER FOR FLEXI CONT (MISCELLANEOUS) ×2 IMPLANT
BANDAGE ELASTIC 3 VELCRO ST LF (GAUZE/BANDAGES/DRESSINGS) ×3 IMPLANT
BLADE MINI RND TIP GREEN BEAV (BLADE) IMPLANT
BLADE SURG 15 STRL LF DISP TIS (BLADE) ×2 IMPLANT
BLADE SURG 15 STRL SS (BLADE) ×6
BNDG CMPR 9X4 STRL LF SNTH (GAUZE/BANDAGES/DRESSINGS) ×1
BNDG ESMARK 4X9 LF (GAUZE/BANDAGES/DRESSINGS) ×2 IMPLANT
BNDG GAUZE ELAST 4 BULKY (GAUZE/BANDAGES/DRESSINGS) ×3 IMPLANT
BRUSH SCRUB EZ PLAIN DRY (MISCELLANEOUS) ×1 IMPLANT
CATH ROBINSON RED A/P 10FR (CATHETERS) IMPLANT
CHLORAPREP W/TINT 26ML (MISCELLANEOUS) ×3 IMPLANT
CORDS BIPOLAR (ELECTRODE) ×3 IMPLANT
COVER BACK TABLE 60X90IN (DRAPES) ×3 IMPLANT
COVER MAYO STAND STRL (DRAPES) ×3 IMPLANT
CUFF TOURNIQUET SINGLE 18IN (TOURNIQUET CUFF) ×2 IMPLANT
DECANTER SPIKE VIAL GLASS SM (MISCELLANEOUS) ×3 IMPLANT
DRAPE EXTREMITY T 121X128X90 (DRAPE) ×3 IMPLANT
DRAPE SURG 17X23 STRL (DRAPES) ×3 IMPLANT
GAUZE SPONGE 4X4 12PLY STRL (GAUZE/BANDAGES/DRESSINGS) ×3 IMPLANT
GAUZE XEROFORM 1X8 LF (GAUZE/BANDAGES/DRESSINGS) ×3 IMPLANT
GLOVE BIO SURGEON STRL SZ 6.5 (GLOVE) ×1 IMPLANT
GLOVE BIO SURGEON STRL SZ7.5 (GLOVE) ×3 IMPLANT
GLOVE BIO SURGEONS STRL SZ 6.5 (GLOVE) ×1
GLOVE BIOGEL M STRL SZ7.5 (GLOVE) ×2 IMPLANT
GLOVE BIOGEL PI IND STRL 7.0 (GLOVE) IMPLANT
GLOVE BIOGEL PI IND STRL 8 (GLOVE) ×1 IMPLANT
GLOVE BIOGEL PI IND STRL 8.5 (GLOVE) IMPLANT
GLOVE BIOGEL PI INDICATOR 7.0 (GLOVE) ×4
GLOVE BIOGEL PI INDICATOR 8 (GLOVE) ×4
GLOVE BIOGEL PI INDICATOR 8.5 (GLOVE)
GLOVE SURG ORTHO 8.0 STRL STRW (GLOVE) ×2 IMPLANT
GOWN STRL REUS W/ TWL LRG LVL3 (GOWN DISPOSABLE) ×1 IMPLANT
GOWN STRL REUS W/TWL LRG LVL3 (GOWN DISPOSABLE) ×3
GOWN STRL REUS W/TWL XL LVL3 (GOWN DISPOSABLE) ×7 IMPLANT
LOOP VESSEL MAXI BLUE (MISCELLANEOUS) IMPLANT
NDL HYPO 25X1 1.5 SAFETY (NEEDLE) IMPLANT
NDL SAFETY ECLIPSE 18X1.5 (NEEDLE) IMPLANT
NEEDLE HYPO 18GX1.5 SHARP (NEEDLE)
NEEDLE HYPO 25X1 1.5 SAFETY (NEEDLE) ×3 IMPLANT
NS IRRIG 1000ML POUR BTL (IV SOLUTION) ×3 IMPLANT
PACK BASIN DAY SURGERY FS (CUSTOM PROCEDURE TRAY) ×3 IMPLANT
PAD CAST 3X4 CTTN HI CHSV (CAST SUPPLIES) ×1 IMPLANT
PAD CAST 4YDX4 CTTN HI CHSV (CAST SUPPLIES) IMPLANT
PADDING CAST ABS 4INX4YD NS (CAST SUPPLIES) ×2
PADDING CAST ABS COTTON 4X4 ST (CAST SUPPLIES) ×1 IMPLANT
PADDING CAST COTTON 3X4 STRL (CAST SUPPLIES) ×3
PADDING CAST COTTON 4X4 STRL (CAST SUPPLIES)
SLEEVE SCD COMPRESS KNEE MED (MISCELLANEOUS) ×2 IMPLANT
SPEAR EYE SURG WECK-CEL (MISCELLANEOUS) ×3 IMPLANT
SPLINT PLASTER CAST XFAST 3X15 (CAST SUPPLIES) IMPLANT
SPLINT PLASTER XTRA FASTSET 3X (CAST SUPPLIES) ×28
STOCKINETTE 4X48 STRL (DRAPES) ×3 IMPLANT
SUT ETHIBOND 3-0 V-5 (SUTURE) IMPLANT
SUT ETHILON 4 0 PS 2 18 (SUTURE) ×5 IMPLANT
SUT FIBERWIRE 4-0 18 TAPR NDL (SUTURE)
SUT MERSILENE 6 0 P 1 (SUTURE) IMPLANT
SUT NYLON 9 0 VRM6 (SUTURE) ×4 IMPLANT
SUT PROLENE 6 0 P 1 18 (SUTURE) IMPLANT
SUT SILK 4 0 PS 2 (SUTURE) ×2 IMPLANT
SUT SUPRAMID 4-0 (SUTURE) IMPLANT
SUT VICRYL 4-0 PS2 18IN ABS (SUTURE) ×2 IMPLANT
SUTURE FIBERWR 4-0 18 TAPR NDL (SUTURE) IMPLANT
SYR BULB 3OZ (MISCELLANEOUS) ×3 IMPLANT
SYR CONTROL 10ML LL (SYRINGE) ×2 IMPLANT
TOWEL OR 17X24 6PK STRL BLUE (TOWEL DISPOSABLE) ×6 IMPLANT
TRAY DSU PREP LF (CUSTOM PROCEDURE TRAY) IMPLANT
UNDERPAD 30X30 (UNDERPADS AND DIAPERS) ×3 IMPLANT

## 2015-02-25 NOTE — Op Note (Signed)
[id number] 

## 2015-02-25 NOTE — Op Note (Signed)
NAME:  MCNESAVINA, OLSHEFSKINO.:  [id number]  MEDICAL RECORD NO.:  [id number]  LOCATION:                                 FACILITY:  PHYSICIAN:  Betha Loa, MD        DATE OF BIRTH:  1955-07-11  DATE OF PROCEDURE:  02/25/2015 DATE OF DISCHARGE:                              OPERATIVE REPORT   POSTOPERATIVE DIAGNOSIS:  Left palm and long finger lacerations.  POSTOPERATIVE DIAGNOSES:  Left ulnar artery laceration in palm, left ulnar nerve partial laceration in palm, left small finger flexor digitorum superficialis zone 3 partial laceration in palm, left long finger radial digital nerve and artery laceration.  PROCEDURES:   1. Left ulnar artery repair in palm under microscope 2. Left ulnar nerve repair in palm under microscope 3. Left small finger flexor digitorum superficialis repair zone 3 4. Left long finger repair of radial digital artery under microscope 5. Left long finger repair of radial digital nerve under microscope.  SURGEON:  Betha Loa, M.D.  ASSISTANT:  Cindee Salt, M.D.  ANESTHESIA:  General.  IV FLUIDS:  Per Anesthesia flow sheet.  ESTIMATED BLOOD LOSS:  Minimal.  COMPLICATIONS:  None.  SPECIMENS:  None.  TOURNIQUET TIME:  52 minutes.  DISPOSITION:  Stable to PACU.  INDICATIONS:  Ms. Thaxton is a 59 year old female who states she lacerated the left palm and long finger while at work last week.  She was seen in the emergency department where the wounds were irrigated and debrided and sutured.  She followed up with me in the office.  She had decreased sensation in the ring and small fingers as well as in the long finger.  I recommended operative exploration of her wound with repair of tendon, artery, and nerve as necessary.  Risks, benefits, and alternatives of surgery were discussed including risk of blood loss; infection; damage to nerves, vessels, tendons, ligaments, bone; failure of surgery; need for additional surgery; complications  with wound healing; continued pain; and continued numbness.  She voiced understanding of these risks and elected to proceed.  OPERATIVE COURSE:  After being identified preoperatively by myself, the patient and I agreed upon the procedure and site of the procedure. Surgical site was marked.  The risks, benefits, and alternatives of surgery were reviewed and she wished to proceed.  Surgical consent had been signed.  She was given IV Ancef as preoperative antibiotic prophylaxis.  She was transferred to the operating room and placed on the operating room table in supine position with the left upper extremity on arm board.  General anesthesia was induced by anesthesiologist.  The left upper extremity was prepped and draped in normal sterile orthopedic fashion.  Surgical pause was performed between surgeons, Anesthesia, and operating room staff, and all were in agreement as to the patient, procedure, and site of the procedure.  The wounds in the palm and long finger were extended proximally and distally in a Brunner fashion.  The sutures had been removed prior to prepping and draping.  In the palm, there was a laceration of the ulnar artery and approximately 50% laceration of the ulnar nerve.  There was a 50% laceration of the FDS  tendon to the small finger.  The FDP tendon to the small finger was intact.  The FDP and FDS to the ring finger were intact.  In the long finger, the radial digital nerve and artery were lacerated.  The FDP and FDS tendons were intact as was the sheath.  The ulnar digital nerve and artery were intact.  The small finger zone 3 FDS laceration was repaired with a 4-0 FiberWire suture in a modified Kessler technique.  The microscope was brought in.  The ulnar nerve in the palm was repaired with 9-0 nylon suture in a circumferential fashion.  Early scarring now was removed.  The arterial ends were freshened.  The artery was repaired using 9-0 nylon suture in  an interrupted circumferential fashion.  This provided good apposition of the arterial ends.  The long finger was then addressed.  The radial digital artery ends were freshened.  The 9-0 nylon suture was used in an interrupted circumferential fashion to repair the artery.  Good apposition of the arterial ends was achieved.  The radial digital nerve ends were freshened and the radial digital nerve then repaired using 9-0 nylon suture in an interrupted circumferential fashion.  This provided good apposition of the nerve ends.  The tourniquet was deflated at 52 minutes.  Fingertips were all pink with brisk capillary refill after deflation of tourniquet.  Bleeding was easily controlled.  The skin was closed with 4-0 nylon in a horizontal mattress fashion.  The wounds were then dressed with sterile Xeroform, 4x4s, and wrapped with a Kerlix bandage.  Dorsal blocking splint was placed and wrapped with Kerlix and an Ace bandage.  Prior to placing the dressings, the wounds were injected with 10 mL total of 0.25% plain Marcaine to aid in postoperative analgesia.  The operative drapes were broken down.  The patient was awoken from anesthesia safely.  She was transferred back to stretcher and taken to the PACU in stable condition.  I will see her back in the office in 1 week for postoperative followup.  I will give her Percocet 5/325, 1 to 2 p.o. q.6 hours p.r.n. pain, dispensed #30.     Betha Loa, MD     KK/MEDQ  D:  02/25/2015  T:  02/25/2015  Job:  [id number]

## 2015-02-25 NOTE — Transfer of Care (Signed)
Immediate Anesthesia Transfer of Care Note  Patient: Penny Manning  Procedure(s) Performed: Procedure(s): LEFT HAND EXPLORATION WITH NERVE, TENDON AND ARTERY REPAIR (Left)  Patient Location: PACU  Anesthesia Type:General  Level of Consciousness: awake and alert   Airway & Oxygen Therapy: Patient Spontanous Breathing and Patient connected to face mask oxygen  Post-op Assessment: Report given to RN and Post -op Vital signs reviewed and stable  Post vital signs: Reviewed and stable  Last Vitals:  Filed Vitals:   02/25/15 1625  BP:   Pulse: 100  Temp:   Resp: 22    Complications: No apparent anesthesia complications

## 2015-02-25 NOTE — Anesthesia Postprocedure Evaluation (Signed)
  Anesthesia Post-op Note  Patient: Penny Manning  Procedure(s) Performed: Procedure(s) (LRB): LEFT HAND EXPLORATION WITH NERVE, TENDON AND ARTERY REPAIR (Left)  Patient Location: PACU  Anesthesia Type: General  Level of Consciousness: awake and alert   Airway and Oxygen Therapy: Patient Spontanous Breathing  Post-op Pain: mild  Post-op Assessment: Post-op Vital signs reviewed, Patient's Cardiovascular Status Stable, Respiratory Function Stable, Patent Airway and No signs of Nausea or vomiting  Last Vitals:  Filed Vitals:   02/25/15 1630  BP: 140/75  Pulse: 97  Temp:   Resp: 21    Post-op Vital Signs: stable   Complications: No apparent anesthesia complications

## 2015-02-25 NOTE — Anesthesia Preprocedure Evaluation (Addendum)
Anesthesia Evaluation  Patient identified by MRN, date of birth, ID band Patient awake    Reviewed: Allergy & Precautions, H&P , NPO status , Patient's Chart, lab work & pertinent test results  History of Anesthesia Complications Negative for: history of anesthetic complications  Airway Mallampati: I  TM Distance: >3 FB Neck ROM: full    Dental  (+) Poor Dentition   Pulmonary COPD,  COPD inhaler, Current Smoker,    Pulmonary exam normal breath sounds clear to auscultation       Cardiovascular hypertension, Pt. on medications Normal cardiovascular exam Rhythm:regular Rate:Normal     Neuro/Psych PSYCHIATRIC DISORDERS Anxiety Depression negative neurological ROS     GI/Hepatic Neg liver ROS, GERD  ,  Endo/Other  negative endocrine ROS  Renal/GU negative Renal ROS     Musculoskeletal   Abdominal   Peds  Hematology negative hematology ROS (+)   Anesthesia Other Findings   Reproductive/Obstetrics negative OB ROS                            Anesthesia Physical Anesthesia Plan  ASA: II  Anesthesia Plan: General   Post-op Pain Management:    Induction: Intravenous  Airway Management Planned: LMA  Additional Equipment:   Intra-op Plan:   Post-operative Plan: Extubation in OR  Informed Consent: I have reviewed the patients History and Physical, chart, labs and discussed the procedure including the risks, benefits and alternatives for the proposed anesthesia with the patient or authorized representative who has indicated his/her understanding and acceptance.   Dental Advisory Given  Plan Discussed with: Anesthesiologist, CRNA and Surgeon  Anesthesia Plan Comments:         Anesthesia Quick Evaluation

## 2015-02-25 NOTE — Anesthesia Procedure Notes (Addendum)
Procedure Name: LMA Insertion Date/Time: 02/25/2015 2:52 PM Performed by: Zenia Resides D Pre-anesthesia Checklist: Patient identified, Emergency Drugs available, Suction available and Patient being monitored Patient Re-evaluated:Patient Re-evaluated prior to inductionOxygen Delivery Method: Circle System Utilized Preoxygenation: Pre-oxygenation with 100% oxygen Intubation Type: IV induction Ventilation: Mask ventilation without difficulty LMA: LMA inserted LMA Size: 4.0 Number of attempts: 1 Airway Equipment and Method: Bite block Placement Confirmation: positive ETCO2 Tube secured with: Tape Dental Injury: Teeth and Oropharynx as per pre-operative assessment

## 2015-02-25 NOTE — Brief Op Note (Signed)
02/25/2015  4:17 PM  PATIENT:  Salome Holmes  59 y.o. female  PRE-OPERATIVE DIAGNOSIS:  left hand lacerations with possible tendon/artery/nerve laceration S65.51, S64.498, Z30.865H  POST-OPERATIVE DIAGNOSIS:  left hand lacerations with tendon/artery/nerve laceration   PROCEDURE:  Procedure(s): LEFT HAND EXPLORATION WITH NERVE, TENDON AND ARTERY REPAIR (Left)  SURGEON:  Surgeon(s) and Role:    * Betha Loa, MD - Primary    * Cindee Salt, MD - Assisting  PHYSICIAN ASSISTANT:   ASSISTANTS: Cindee Salt, MD   ANESTHESIA:   general  EBL:  Total I/O In: 1300 [I.V.:1300] Out: -   BLOOD ADMINISTERED:none  DRAINS: none   LOCAL MEDICATIONS USED:  MARCAINE     SPECIMEN:  No Specimen  DISPOSITION OF SPECIMEN:  N/A  COUNTS:  YES  TOURNIQUET:   Total Tourniquet Time Documented: Upper Arm (Left) - 52 minutes Total: Upper Arm (Left) - 52 minutes   DICTATION: .Other Dictation: Dictation Number [id number]  PLAN OF CARE: Discharge to home after PACU  PATIENT DISPOSITION:  PACU - hemodynamically stable.

## 2015-02-25 NOTE — Discharge Instructions (Addendum)

## 2015-02-25 NOTE — H&P (Signed)
  Penny Manning is an 59 y.o. female.   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  Seen at [place] where wounds cleaned and sutured.  Followed up in office.  Reports no previous injury to left hand and no other injury at this time.  She has noted some numbness in hand and fingers.  Past Medical History  Diagnosis Date  . Hypertension   . Acid reflux   . COPD (chronic obstructive pulmonary disease)   . Anxiety   . Asthma   . Depression   . Vertigo     Past Surgical History  Procedure Laterality Date  . Abdominal hysterectomy    . Foot surgery Right     History reviewed. No pertinent family history. Social History:  reports that she has been smoking.  She does not have any smokeless tobacco history on file. She reports that she drinks alcohol. She reports that she does not use illicit drugs.  Allergies: No Known Allergies  No prescriptions prior to admission    Results for orders placed or performed during the hospital encounter of 02/24/15 (from the past 48 hour(s))  Basic metabolic panel     Status: Abnormal   Collection Time: 02/24/15  2:50 PM  Result Value Ref Range   Sodium 136 135 - 145 mmol/L   Potassium 3.8 3.5 - 5.1 mmol/L   Chloride 102 101 - 111 mmol/L   CO2 28 22 - 32 mmol/L   Glucose, Bld 98 65 - 99 mg/dL   BUN 29 (H) 6 - 20 mg/dL   Creatinine, Ser 0.96 0.44 - 1.00 mg/dL   Calcium 8.8 (L) 8.9 - 10.3 mg/dL   GFR calc non Af Amer >60 >60 mL/min   GFR calc Af Amer >60 >60 mL/min    Comment: (NOTE) The eGFR has been calculated using the CKD EPI equation. This calculation has not been validated in all clinical situations. eGFR's persistently <60 mL/min signify possible Chronic Kidney Disease.    Anion gap 6 5 - 15    No results found.   A comprehensive review of systems was negative except for: Respiratory: positive for  asthma Behavioral/Psych: positive for anxiety  Height $Remov'5\' 3"'yXYdnC$  (1.6 m), weight 58.968 kg (130 lb).  General appearance: alert, cooperative and appears stated age Head: Normocephalic, without obvious abnormality, atraumatic Neck: supple, symmetrical, trachea midline Resp: clear to auscultation bilaterally Cardio: regular rate and rhythm GI: non tender Extremities: decreased sensation in left long/ring/small fingers.  intact capillary refill all digits.  +epl/fpl/io.  laceration ulnar side of palm and long finger. Pulses: 2+ and symmetric Skin: Skin color, texture, turgor normal. No rashes or lesions Neurologic: Grossly normal except as above Incision/Wound: As above  Assessment/Plan Left palm and long finger lacerations with possible tendon/artery/nerve lacerations.  Non operative and operative treatment options were discussed with the patient and patient wishes to proceed with operative treatment. Recommend exploration with repair tendon/artery/nerve as necessary.  Risks, benefits, and alternatives of surgery were discussed and the patient agrees with the plan of care.   KUZMA,KEVIN R 02/25/2015, 9:30 AM

## 2015-02-26 ENCOUNTER — Encounter (HOSPITAL_BASED_OUTPATIENT_CLINIC_OR_DEPARTMENT_OTHER): Payer: Self-pay | Admitting: Orthopedic Surgery

## 2015-02-26 NOTE — Addendum Note (Signed)
Addendum  created 02/26/15 1102 by Jewel Baize Blocker, CRNA   Modules edited: Charges VN

## 2015-08-31 ENCOUNTER — Ambulatory Visit (HOSPITAL_COMMUNITY)
Admission: RE | Admit: 2015-08-31 | Discharge: 2015-08-31 | Disposition: A | Discharge status: Home / Self Care | Payer: BLUE CROSS/BLUE SHIELD | Source: Ambulatory Visit | Attending: Internal Medicine | Admitting: Internal Medicine

## 2015-08-31 ENCOUNTER — Other Ambulatory Visit (HOSPITAL_COMMUNITY): Payer: Self-pay | Admitting: Internal Medicine

## 2015-08-31 DIAGNOSIS — R05 Cough: Secondary | ICD-10-CM | POA: Diagnosis present

## 2015-08-31 DIAGNOSIS — R062 Wheezing: Secondary | ICD-10-CM | POA: Insufficient documentation

## 2015-08-31 DIAGNOSIS — R0602 Shortness of breath: Secondary | ICD-10-CM | POA: Insufficient documentation

## 2015-08-31 DIAGNOSIS — R059 Cough, unspecified: Secondary | ICD-10-CM

## 2015-09-10 ENCOUNTER — Other Ambulatory Visit (HOSPITAL_COMMUNITY): Payer: Self-pay | Admitting: Respiratory Therapy

## 2015-09-10 DIAGNOSIS — J441 Chronic obstructive pulmonary disease with (acute) exacerbation: Secondary | ICD-10-CM

## 2015-09-24 ENCOUNTER — Ambulatory Visit (HOSPITAL_COMMUNITY): Admission: RE | Admit: 2015-09-24 | Payer: BLUE CROSS/BLUE SHIELD | Source: Ambulatory Visit

## 2016-07-13 ENCOUNTER — Other Ambulatory Visit (HOSPITAL_COMMUNITY): Payer: Self-pay | Admitting: Internal Medicine

## 2016-07-13 DIAGNOSIS — Z1231 Encounter for screening mammogram for malignant neoplasm of breast: Secondary | ICD-10-CM

## 2016-07-17 ENCOUNTER — Other Ambulatory Visit (HOSPITAL_COMMUNITY): Payer: Self-pay | Admitting: Internal Medicine

## 2016-07-17 ENCOUNTER — Ambulatory Visit (HOSPITAL_COMMUNITY)
Admission: RE | Admit: 2016-07-17 | Discharge: 2016-07-17 | Disposition: A | Discharge status: Home / Self Care | Payer: BLUE CROSS/BLUE SHIELD | Source: Ambulatory Visit | Attending: Internal Medicine | Admitting: Internal Medicine

## 2016-07-17 DIAGNOSIS — R059 Cough, unspecified: Secondary | ICD-10-CM

## 2016-07-17 DIAGNOSIS — R05 Cough: Secondary | ICD-10-CM

## 2016-07-17 DIAGNOSIS — J449 Chronic obstructive pulmonary disease, unspecified: Secondary | ICD-10-CM | POA: Diagnosis not present

## 2016-07-19 ENCOUNTER — Ambulatory Visit (HOSPITAL_COMMUNITY)
Admission: RE | Admit: 2016-07-19 | Discharge: 2016-07-19 | Disposition: A | Discharge status: Home / Self Care | Payer: BLUE CROSS/BLUE SHIELD | Source: Ambulatory Visit | Attending: Internal Medicine | Admitting: Internal Medicine

## 2016-07-19 DIAGNOSIS — Z1231 Encounter for screening mammogram for malignant neoplasm of breast: Secondary | ICD-10-CM

## 2017-01-06 ENCOUNTER — Emergency Department (HOSPITAL_COMMUNITY): Payer: BLUE CROSS/BLUE SHIELD

## 2017-01-06 ENCOUNTER — Encounter (HOSPITAL_COMMUNITY): Payer: Self-pay | Admitting: Cardiology

## 2017-01-06 ENCOUNTER — Inpatient Hospital Stay (HOSPITAL_COMMUNITY)
Admission: EM | Admit: 2017-01-06 | Discharge: 2017-01-09 | DRG: 190 | Disposition: A | Discharge status: Home / Self Care | Payer: BLUE CROSS/BLUE SHIELD | Attending: Internal Medicine | Admitting: Internal Medicine

## 2017-01-06 DIAGNOSIS — J441 Chronic obstructive pulmonary disease with (acute) exacerbation: Principal | ICD-10-CM | POA: Diagnosis present

## 2017-01-06 DIAGNOSIS — F419 Anxiety disorder, unspecified: Secondary | ICD-10-CM | POA: Diagnosis present

## 2017-01-06 DIAGNOSIS — R0602 Shortness of breath: Secondary | ICD-10-CM

## 2017-01-06 DIAGNOSIS — I10 Essential (primary) hypertension: Secondary | ICD-10-CM | POA: Diagnosis not present

## 2017-01-06 DIAGNOSIS — Z825 Family history of asthma and other chronic lower respiratory diseases: Secondary | ICD-10-CM

## 2017-01-06 DIAGNOSIS — K219 Gastro-esophageal reflux disease without esophagitis: Secondary | ICD-10-CM | POA: Diagnosis not present

## 2017-01-06 DIAGNOSIS — J9601 Acute respiratory failure with hypoxia: Secondary | ICD-10-CM | POA: Diagnosis present

## 2017-01-06 DIAGNOSIS — F17210 Nicotine dependence, cigarettes, uncomplicated: Secondary | ICD-10-CM | POA: Diagnosis present

## 2017-01-06 DIAGNOSIS — R509 Fever, unspecified: Secondary | ICD-10-CM

## 2017-01-06 LAB — CBC WITH DIFFERENTIAL/PLATELET
BASOS ABS: 0 10*3/uL (ref 0.0–0.1)
Basophils Relative: 0 %
Eosinophils Absolute: 0.4 10*3/uL (ref 0.0–0.7)
Eosinophils Relative: 4 %
HCT: 39.4 % (ref 36.0–46.0)
Hemoglobin: 12.7 g/dL (ref 12.0–15.0)
LYMPHS PCT: 22 %
Lymphs Abs: 2.2 10*3/uL (ref 0.7–4.0)
MCH: 30 pg (ref 26.0–34.0)
MCHC: 32.2 g/dL (ref 30.0–36.0)
MCV: 93.1 fL (ref 78.0–100.0)
MONOS PCT: 12 %
Monocytes Absolute: 1.2 10*3/uL — ABNORMAL HIGH (ref 0.1–1.0)
NEUTROS ABS: 6.3 10*3/uL (ref 1.7–7.7)
NEUTROS PCT: 62 %
PLATELETS: 292 10*3/uL (ref 150–400)
RBC: 4.23 MIL/uL (ref 3.87–5.11)
RDW: 13.2 % (ref 11.5–15.5)
WBC: 10.1 10*3/uL (ref 4.0–10.5)

## 2017-01-06 LAB — COMPREHENSIVE METABOLIC PANEL
ALT: 24 U/L (ref 14–54)
AST: 18 U/L (ref 15–41)
Albumin: 3.7 g/dL (ref 3.5–5.0)
Alkaline Phosphatase: 92 U/L (ref 38–126)
Anion gap: 8 (ref 5–15)
BUN: 13 mg/dL (ref 6–20)
CHLORIDE: 106 mmol/L (ref 101–111)
CO2: 30 mmol/L (ref 22–32)
CREATININE: 0.65 mg/dL (ref 0.44–1.00)
Calcium: 10 mg/dL (ref 8.9–10.3)
GFR calc non Af Amer: >60 mL/min (ref >60)
Glucose, Bld: 104 mg/dL — ABNORMAL HIGH (ref 65–99)
Potassium: 4.8 mmol/L (ref 3.5–5.1)
SODIUM: 144 mmol/L (ref 135–145)
Total Bilirubin: 0.5 mg/dL (ref 0.3–1.2)
Total Protein: 7.3 g/dL (ref 6.5–8.1)

## 2017-01-06 LAB — TROPONIN I: Troponin I: <0.03 ng/mL (ref <0.03)

## 2017-01-06 LAB — BRAIN NATRIURETIC PEPTIDE: B NATRIURETIC PEPTIDE 5: 39 pg/mL (ref 0.0–100.0)

## 2017-01-06 MED ORDER — ENOXAPARIN SODIUM 40 MG/0.4ML ~~LOC~~ SOLN
40.0000 mg | SUBCUTANEOUS | Status: DC
  Administered 2017-01-06 – 2017-01-08 (×3): 40 mg via SUBCUTANEOUS
  Filled 2017-01-06 (×3): qty 0.4

## 2017-01-06 MED ORDER — ATORVASTATIN CALCIUM 20 MG PO TABS
20.0000 mg | ORAL_TABLET | Freq: Every day | ORAL | Status: DC
  Administered 2017-01-06 – 2017-01-08 (×3): 20 mg via ORAL
  Filled 2017-01-06 (×3): qty 1

## 2017-01-06 MED ORDER — TEMAZEPAM 15 MG PO CAPS
15.0000 mg | ORAL_CAPSULE | Freq: Every day | ORAL | Status: DC
  Administered 2017-01-06 – 2017-01-08 (×3): 15 mg via ORAL
  Filled 2017-01-06 (×3): qty 1

## 2017-01-06 MED ORDER — GUAIFENESIN ER 600 MG PO TB12
600.0000 mg | ORAL_TABLET | Freq: Two times a day (BID) | ORAL | Status: DC
  Administered 2017-01-06 – 2017-01-09 (×6): 600 mg via ORAL
  Filled 2017-01-06 (×6): qty 1

## 2017-01-06 MED ORDER — HYDROCODONE-HOMATROPINE 5-1.5 MG/5ML PO SYRP
5.0000 mL | ORAL_SOLUTION | Freq: Once | ORAL | Status: AC
Start: 2017-01-06 — End: 2017-01-06
  Administered 2017-01-06: 5 mL via ORAL
  Filled 2017-01-06: qty 5

## 2017-01-06 MED ORDER — MOMETASONE FURO-FORMOTEROL FUM 200-5 MCG/ACT IN AERO
2.0000 | INHALATION_SPRAY | Freq: Two times a day (BID) | RESPIRATORY_TRACT | Status: DC
  Administered 2017-01-07 – 2017-01-09 (×5): 2 via RESPIRATORY_TRACT
  Filled 2017-01-06: qty 8.8

## 2017-01-06 MED ORDER — ALPRAZOLAM 0.5 MG PO TABS
0.5000 mg | ORAL_TABLET | Freq: Two times a day (BID) | ORAL | Status: DC | PRN
  Administered 2017-01-07 – 2017-01-08 (×3): 0.5 mg via ORAL
  Filled 2017-01-06 (×3): qty 1

## 2017-01-06 MED ORDER — ONDANSETRON HCL 4 MG PO TABS: 4.0000 mg | ORAL_TABLET | Freq: Four times a day (QID) | ORAL | Status: DC | PRN

## 2017-01-06 MED ORDER — PREDNISONE 10 MG PO TABS
60.0000 mg | ORAL_TABLET | Freq: Once | ORAL | Status: AC
  Administered 2017-01-06: 60 mg via ORAL
  Filled 2017-01-06: qty 1

## 2017-01-06 MED ORDER — ONDANSETRON HCL 4 MG/2ML IJ SOLN: 4.0000 mg | Freq: Four times a day (QID) | INTRAMUSCULAR | Status: DC | PRN

## 2017-01-06 MED ORDER — TIOTROPIUM BROMIDE MONOHYDRATE 18 MCG IN CAPS
18.0000 ug | ORAL_CAPSULE | Freq: Every day | RESPIRATORY_TRACT | Status: DC
  Administered 2017-01-07 – 2017-01-09 (×3): 18 ug via RESPIRATORY_TRACT
  Filled 2017-01-06: qty 5

## 2017-01-06 MED ORDER — LORATADINE 10 MG PO TABS
10.0000 mg | ORAL_TABLET | Freq: Every day | ORAL | Status: DC
  Administered 2017-01-06 – 2017-01-08 (×3): 10 mg via ORAL
  Filled 2017-01-06 (×3): qty 1

## 2017-01-06 MED ORDER — ACETAMINOPHEN 325 MG PO TABS
650.0000 mg | ORAL_TABLET | Freq: Four times a day (QID) | ORAL | Status: DC | PRN
  Administered 2017-01-06 – 2017-01-08 (×4): 650 mg via ORAL
  Filled 2017-01-06 (×4): qty 2

## 2017-01-06 MED ORDER — PREDNISONE 20 MG PO TABS
60.0000 mg | ORAL_TABLET | Freq: Every day | ORAL | Status: DC
  Administered 2017-01-07: 60 mg via ORAL
  Filled 2017-01-06: qty 3

## 2017-01-06 MED ORDER — FUROSEMIDE 10 MG/ML IJ SOLN
40.0000 mg | Freq: Once | INTRAMUSCULAR | Status: AC
  Administered 2017-01-06: 40 mg via INTRAVENOUS
  Filled 2017-01-06: qty 4

## 2017-01-06 MED ORDER — LEVOCETIRIZINE DIHYDROCHLORIDE 5 MG PO TABS: 5.0000 mg | ORAL_TABLET | Freq: Every evening | ORAL | Status: DC

## 2017-01-06 MED ORDER — PANTOPRAZOLE SODIUM 40 MG PO TBEC
40.0000 mg | DELAYED_RELEASE_TABLET | Freq: Two times a day (BID) | ORAL | Status: DC
  Administered 2017-01-06 – 2017-01-09 (×6): 40 mg via ORAL
  Filled 2017-01-06 (×6): qty 1

## 2017-01-06 MED ORDER — IPRATROPIUM-ALBUTEROL 0.5-2.5 (3) MG/3ML IN SOLN
3.0000 mL | RESPIRATORY_TRACT | Status: AC
  Administered 2017-01-06 (×3): 3 mL via RESPIRATORY_TRACT
  Filled 2017-01-06: qty 3

## 2017-01-06 MED ORDER — AZITHROMYCIN 250 MG PO TABS
500.0000 mg | ORAL_TABLET | Freq: Once | ORAL | Status: AC
  Administered 2017-01-06: 500 mg via ORAL
  Filled 2017-01-06: qty 2

## 2017-01-06 MED ORDER — ACETAMINOPHEN 650 MG RE SUPP: 650.0000 mg | Freq: Four times a day (QID) | RECTAL | Status: DC | PRN

## 2017-01-06 MED ORDER — TIOTROPIUM BROMIDE MONOHYDRATE 18 MCG IN CAPS
18.0000 ug | ORAL_CAPSULE | Freq: Every day | RESPIRATORY_TRACT | Status: DC
  Filled 2017-01-06: qty 5

## 2017-01-06 MED ORDER — SERTRALINE HCL 50 MG PO TABS
100.0000 mg | ORAL_TABLET | Freq: Every evening | ORAL | Status: DC
  Administered 2017-01-06 – 2017-01-08 (×3): 100 mg via ORAL
  Filled 2017-01-06 (×3): qty 2

## 2017-01-06 MED ORDER — ALBUTEROL SULFATE (2.5 MG/3ML) 0.083% IN NEBU
2.5000 mg | INHALATION_SOLUTION | Freq: Four times a day (QID) | RESPIRATORY_TRACT | Status: DC
  Administered 2017-01-06: 2.5 mg via RESPIRATORY_TRACT
  Filled 2017-01-06: qty 3

## 2017-01-06 NOTE — ED Triage Notes (Signed)
Cough and fever since Thursday. 

## 2017-01-06 NOTE — ED Notes (Signed)
EDP in to reassess

## 2017-01-06 NOTE — ED Provider Notes (Signed)
[facility]-EM[facility]ENCY DEPT Provider Note   CSN: [id number] Arrival date & time: 01/06/17  1151     History   Chief Complaint Chief Complaint  Patient presents with  . Fever    HPI Penny Manning is a 61 y.o. female.  HPI  This is a 61 year old female who has past medical history of COPD, hypertension and asthma mother presents the emergency department with a few days of cough and fever as high as 101. Patient states that this has been progressively worsening. She's had some left-sided chest discomfort this seems to be worse with coughing. Also worse with deep breaths. No productive cough. No recent sick contacts. She does still smoke but has used her albuterol; without much relief. Has not seen what else for these symptoms since this started. No other associated modifying symptoms.  Past Medical History:  Diagnosis Date  . Acid reflux   . Anxiety   . Asthma   . COPD (chronic obstructive pulmonary disease) (HCC)   . Depression   . Hypertension   . Vertigo     Patient Active Problem List   Diagnosis Date Noted  . Acute respiratory failure with hypoxia (HCC) 01/06/2017    Past Surgical History:  Procedure Laterality Date  . ABDOMINAL HYSTERECTOMY    . FOOT SURGERY Right   . NERVE, TENDON AND ARTERY REPAIR Left 02/25/2015   Procedure: LEFT HAND EXPLORATION WITH NERVE, TENDON AND ARTERY REPAIR;  Surgeon: Betha LoaKevin Kuzma, MD;  Location: [facility];  Service: Orthopedics;  Laterality: Left;    OB History    No data available       Home Medications    Prior to Admission medications   Medication Sig Start Date End Date Taking? Authorizing Provider  acetaminophen (TYLENOL) 500 MG tablet Take 500 mg by mouth every 6 (six) hours as needed for mild pain.   Yes [provider]  ALPRAZolam Prudy Feeler(XANAX) 0.5 MG tablet Take 1 tablet by mouth 2 (two) times daily as needed for anxiety.  01/21/15  Yes [provider]  atorvastatin (LIPITOR) 20 MG tablet Take 20 mg  by mouth daily.   Yes [provider]  buPROPion (WELLBUTRIN XL) 150 MG 24 hr tablet Take 150 mg by mouth daily. 12/01/16  Yes [provider]  levocetirizine (XYZAL) 5 MG tablet Take 5 mg by mouth every evening.   Yes [provider]  meclizine (ANTIVERT) 25 MG tablet Take 1 tablet by mouth 4 (four) times daily as needed for dizziness.  02/12/15  Yes [provider]  pantoprazole (PROTONIX) 40 MG tablet Take 1 tablet by mouth 2 (two) times daily. 01/11/15  Yes [provider]  PROAIR HFA 108 (90 BASE) MCG/ACT inhaler Inhale 1-2 puffs into the lungs every 6 (six) hours as needed for wheezing.  01/21/15  Yes [provider]  sertraline (ZOLOFT) 100 MG tablet Take 100 mg by mouth every evening. 01/11/15  Yes [provider]  SYMBICORT 160-4.5 MCG/ACT inhaler Inhale 2 puffs into the lungs 2 (two) times daily. 01/10/15  Yes [provider]  temazepam (RESTORIL) 15 MG capsule Take 15 mg by mouth at bedtime.   Yes [provider]    Family History History reviewed. No pertinent family history.  Social History Social History  Substance Use Topics  . Smoking status: Current Every Day Smoker    Packs/day: 0.25    Last attempt to quit: 02/10/2015  . Smokeless tobacco: Not on file  . Alcohol use Yes  Comment: social     Allergies   Patient has no known allergies.   Review of Systems Review of Systems  All other systems reviewed and are negative.    Physical Exam Updated Vital Signs BP 116/74   Pulse 77   Temp 98.1 F (36.7 C) (Oral)   Resp (!) 21   Ht 5\' 3"  (1.6 m)   Wt 59 kg (130 lb)   SpO2 (!) 89%   BMI 23.03 kg/m   Physical Exam  Constitutional: She is oriented to person, place, and time. She appears well-developed and well-nourished.  HENT:  Head: Normocephalic and atraumatic.  Eyes: Conjunctivae and EOM are normal.  Neck: Normal range of motion.  Cardiovascular: Normal rate and regular rhythm.     Pulmonary/Chest: No stridor. No respiratory distress. She has decreased breath sounds.  Abdominal: Soft. She exhibits no distension.  Musculoskeletal: She exhibits no edema or tenderness.  Neurological: She is alert and oriented to person, place, and time. No cranial nerve deficit. Coordination normal.  Skin: Skin is warm and dry.  Nursing note and vitals reviewed.    ED Treatments / Results  Labs (all labs ordered are listed, but only abnormal results are displayed) Labs Reviewed  CBC WITH DIFFERENTIAL/PLATELET - Abnormal; Notable for the following:       Result Value   Monocytes Absolute 1.2 (*)    All other components within normal limits  COMPREHENSIVE METABOLIC PANEL - Abnormal; Notable for the following:    Glucose, Bld 104 (*)    All other components within normal limits  TROPONIN I  BRAIN NATRIURETIC PEPTIDE    EKG  EKG Interpretation  Date/Time:  Saturday January 06 2017 13:43:41 EDT Ventricular Rate:  77 PR Interval:    QRS Duration: 100 QT Interval:  402 QTC Calculation: 455 R Axis:   56 Text Interpretation:  Sinus rhythm Probable left atrial enlargement Abnormal R-wave progression, early transition Borderline T abnormalities, anterior leads nonspecific T wave changes are not new compared to september 2016 Confirmed by Marily Memos [phone]) on 01/06/2017 1:47:14 PM       Radiology Dg Chest 2 View  Result Date: 01/06/2017 CLINICAL DATA:  61 year old female with cough and fever since Thursday EXAM: CHEST  2 VIEW COMPARISON:  Prior chest x-ray 07/17/2016 FINDINGS: Pulmonary hyperinflation with extensive bronchitic changes and mild interstitial prominence appears similar compared to prior. However, there is slightly increased pulmonary vascular congestion on today's examination. Cardiac and mediastinal contours are within normal limits. Atherosclerotic calcification present in the transverse aorta. No pleural effusion or pneumothorax. No focal airspace consolidation. No  acute osseous abnormality. IMPRESSION: 1. Increased pulmonary vascular congestion bordering on mild interstitial edema. 2. Background hyperinflation and chronic bronchitic changes is similar compared to prior. 3.  Aortic Atherosclerosis (ICD10-170.0) Electronically Signed   By: Malachy Moan M.D.   On: 01/06/2017 12:56    Procedures Procedures (including critical care time)  CRITICAL CARE Performed by: Marily Memos Total critical care time: 35 minutes Critical care time was exclusive of separately billable procedures and treating other patients. Critical care was necessary to treat or prevent imminent or life-threatening deterioration. Critical care was time spent personally by me on the following activities: development of treatment plan with patient and/or surrogate as well as nursing, discussions with consultants, evaluation of patient's response to treatment, examination of patient, obtaining history from patient or surrogate, ordering and performing treatments and interventions, ordering and review of laboratory studies, ordering and review of radiographic studies, pulse oximetry and  re-evaluation of patient's condition.   Medications Ordered in ED Medications  ipratropium-albuterol (DUONEB) 0.5-2.5 (3) MG/3ML nebulizer solution 3 mL (3 mLs Nebulization Given 01/06/17 1325)  predniSONE (DELTASONE) tablet 60 mg (60 mg Oral Given 01/06/17 1323)  HYDROcodone-homatropine (HYCODAN) 5-1.5 MG/5ML syrup 5 mL (5 mLs Oral Given 01/06/17 1322)  azithromycin (ZITHROMAX) tablet 500 mg (500 mg Oral Given 01/06/17 1323)  furosemide (LASIX) injection 40 mg (40 mg Intravenous Given 01/06/17 1627)     Initial Impression / Assessment and Plan / ED Course  I have reviewed the triage vital signs and the nursing notes.  Pertinent labs & imaging results that were available during my care of the patient were reviewed by me and considered in my medical decision making (see chart for details).  Patient was signs  symptoms mostly consistent with bronchitis hour chest x-ray is: Possible interstitial edema. Secondary to this we'll do a cardiac workup to ensure she is not in acute heart failure but otherwise treat her for bronchitis/COPD exacerbation.  Clinical Course as of Jan 07 1643  Sat Jan 06, 2017  1429 Suspect xr is more consistent with possible atypical pneumonia. Will treat for same. Improved after duonebs, will likely dc to follow up with PCP.  [JM]  1543 SpO2: (!) 89 % [JM]  1543 Patient still tachypneic and hypoxic on room air rest. She desats to 85% and lower with an duration. Started on oxygen. Will need admission for further monitoring. Resp: (!) 21 [JM]    Clinical Course User Index [JM] Sameeha Rockefeller, Barbara CowerJason, MD      Final Clinical Impressions(s) / ED Diagnoses   Final diagnoses:  Acute respiratory failure with hypoxia (HCC)  SOB (shortness of breath)  Fever, unspecified fever cause      Mayah Urquidi, Barbara CowerJason, MD 01/06/17 1645

## 2017-01-06 NOTE — ED Notes (Signed)
Pt ambulated on room air. O2 stayed between %85-%86. Pt not complaining of any SOB. Pt helped back to bed and placed on 1L of O2 per Dr. Clayborne DanaMesner. O2 at %95 at this time.

## 2017-01-06 NOTE — ED Notes (Signed)
Report to Morgan, RN 

## 2017-01-06 NOTE — ED Notes (Signed)
ED Provider at bedside. 

## 2017-01-06 NOTE — ED Notes (Signed)
Call to respiratory 

## 2017-01-06 NOTE — ED Notes (Signed)
Report received 

## 2017-01-06 NOTE — H&P (Signed)
History and Physical  Penny HolmesCarol W Stemler ZOX:[id number] DOB: 05/19/1956 DOA: 01/06/2017  Referring physician: Dr Clayborne DanaMesner, ED physician PCP: Benita StabileHall, [name], MD  Outpatient Specialists: none  Patient Coming From: Home  Chief Complaint: Shortness of breath  HPI: Penny Manning is a 61 y.o. female with a history of hypertension, COPD, anxiety, acid reflux, depression. Patient presents with 5 days of worsening shortness of breath, worse with exertion and improved with rest. Has been using her albuterol inhaler every 6 hours, which is helpful for short period of time. No other provoking or palliating factors. Denies chest pain, orthopnea. Does have cough with normal sputum production.  Emergency Department Course: Patient received nebulizer treatments which improved patient's work of breathing. She also received a dose of prednisone and IV Lasix. Patient is still hypoxic with ambulation with oxygen saturations down to the mid 80s.  Review of Systems:   Pt denies any fevers, chills, nausea, vomiting, diarrhea, constipation, abdominal pain, orthopnea, wheezing, palpitations, headache, vision changes, lightheadedness, dizziness, melena, rectal bleeding.  Review of systems are otherwise negative  Past Medical History:  Diagnosis Date  . Acid reflux   . Anxiety   . Asthma   . COPD (chronic obstructive pulmonary disease) (HCC)   . Depression   . Hypertension   . Vertigo    Past Surgical History:  Procedure Laterality Date  . ABDOMINAL HYSTERECTOMY    . FOOT SURGERY Right   . NERVE, TENDON AND ARTERY REPAIR Left 02/25/2015   Procedure: LEFT HAND EXPLORATION WITH NERVE, TENDON AND ARTERY REPAIR;  Surgeon: Betha LoaKevin Kuzma, MD;  Location: [facility];  Service: Orthopedics;  Laterality: Left;   Social History:  reports that she has been smoking.  She has been smoking about 0.25 packs per day. She does not have any smokeless tobacco history on file. She reports that she drinks alcohol. She  reports that she does not use drugs. Patient lives at Home  No Known Allergies  Family history of COPD  Prior to Admission medications   Medication Sig Start Date End Date Taking? Authorizing Provider  acetaminophen (TYLENOL) 500 MG tablet Take 500 mg by mouth every 6 (six) hours as needed for mild pain.   Yes [provider]  ALPRAZolam Prudy Feeler(XANAX) 0.5 MG tablet Take 1 tablet by mouth 2 (two) times daily as needed for anxiety.  01/21/15  Yes [provider]  atorvastatin (LIPITOR) 20 MG tablet Take 20 mg by mouth daily.   Yes [provider]  buPROPion (WELLBUTRIN XL) 150 MG 24 hr tablet Take 150 mg by mouth daily. 12/01/16  Yes [provider]  levocetirizine (XYZAL) 5 MG tablet Take 5 mg by mouth every evening.   Yes [provider]  meclizine (ANTIVERT) 25 MG tablet Take 1 tablet by mouth 4 (four) times daily as needed for dizziness.  02/12/15  Yes [provider]  pantoprazole (PROTONIX) 40 MG tablet Take 1 tablet by mouth 2 (two) times daily. 01/11/15  Yes [provider]  PROAIR HFA 108 (90 BASE) MCG/ACT inhaler Inhale 1-2 puffs into the lungs every 6 (six) hours as needed for wheezing.  01/21/15  Yes [provider]  sertraline (ZOLOFT) 100 MG tablet Take 100 mg by mouth every evening. 01/11/15  Yes [provider]  SYMBICORT 160-4.5 MCG/ACT inhaler Inhale 2 puffs into the lungs 2 (two) times daily. 01/10/15  Yes [provider]  temazepam (RESTORIL) 15 MG capsule Take 15 mg by mouth at bedtime.   Yes  [provider]    Physical Exam: BP 126/61   Pulse 80   Temp 98.1 F (36.7 C) (Oral)   Resp (!) 27   Ht 5\' 3"  (1.6 m)   Wt 59 kg (130 lb)   SpO2 96%   BMI 23.03 kg/m   General: Elderly Caucasian female. Awake and alert and oriented x3. No acute cardiopulmonary distress.  HEENT: Normocephalic atraumatic.  Right and left ears normal in appearance.  Pupils equal, round, reactive to light.  Extraocular muscles are intact. Sclerae anicteric and noninjected.  Moist mucosal membranes. No mucosal lesions.  Neck: Neck supple without lymphadenopathy. No carotid bruits. No masses palpated.  Cardiovascular: Regular rate with normal S1-S2 sounds. No murmurs, rubs, gallops auscultated. No JVD.  Respiratory: Prolonged exhalation phase. Lung sounds tight. Mild wheezing throughout. No rales or rhonchi. Abdomen: Soft, nontender, nondistended. Active bowel sounds. No masses or hepatosplenomegaly  Skin: No rashes, lesions, or ulcerations.  Dry, warm to touch. 2+ dorsalis pedis and radial pulses. Musculoskeletal: No calf or leg pain. All major joints not erythematous nontender.  No upper or lower joint deformation.  Good ROM.  No contractures  Psychiatric: Intact judgment and insight. Pleasant and cooperative. Neurologic: No focal neurological deficits. Strength is 5/5 and symmetric in upper and lower extremities.  Cranial nerves II through XII are grossly intact.           Labs on Admission: I have personally reviewed following labs and imaging studies  CBC:  Recent Labs Lab 01/06/17 1312  WBC 10.1  NEUTROABS 6.3  HGB 12.7  HCT 39.4  MCV 93.1  PLT 292   Basic Metabolic Panel:  Recent Labs Lab 01/06/17 1312  NA 144  K 4.8  CL 106  CO2 30  GLUCOSE 104*  BUN 13  CREATININE 0.65  CALCIUM 10.0   GFR: Estimated Creatinine Clearance: 61.9 mL/min (by C-G formula based on SCr of 0.65 mg/dL). Liver Function Tests:  Recent Labs Lab 01/06/17 1312  AST 18  ALT 24  ALKPHOS 92  BILITOT 0.5  PROT 7.3  ALBUMIN 3.7   No results for input(s): LIPASE, AMYLASE in the last 168 hours. No results for input(s): AMMONIA in the last 168 hours. Coagulation Profile: No results for input(s): INR, PROTIME in the last 168 hours. Cardiac Enzymes:  Recent Labs Lab 01/06/17 1312  TROPONINI <0.03   BNP (last 3 results) No results for input(s): PROBNP in the last 8760 hours. HbA1C: No  results for input(s): HGBA1C in the last 72 hours. CBG: No results for input(s): GLUCAP in the last 168 hours. Lipid Profile: No results for input(s): CHOL, HDL, LDLCALC, TRIG, CHOLHDL, LDLDIRECT in the last 72 hours. Thyroid Function Tests: No results for input(s): TSH, T4TOTAL, FREET4, T3FREE, THYROIDAB in the last 72 hours. Anemia Panel: No results for input(s): VITAMINB12, FOLATE, FERRITIN, TIBC, IRON, RETICCTPCT in the last 72 hours. Urine analysis:    Component Value Date/Time   COLORURINE YELLOW 09/10/2007 1023   APPEARANCEUR CLEAR 09/10/2007 1023   LABSPEC 1.005 09/10/2007 1023   PHURINE 5.5 09/10/2007 1023   GLUCOSEU NEGATIVE 09/10/2007 1023   HGBUR NEGATIVE 09/10/2007 1023   BILIRUBINUR NEGATIVE 09/10/2007 1023   KETONESUR NEGATIVE 09/10/2007 1023   PROTEINUR NEGATIVE 09/10/2007 1023   UROBILINOGEN 0.2 09/10/2007 1023   NITRITE NEGATIVE 09/10/2007 1023   LEUKOCYTESUR MODERATE (A) 09/10/2007 1023   Sepsis Labs: @LABRCNTIP (procalcitonin:4,lacticidven:4) )No results found for this or any previous visit (from the past 240 hour(s)).   Radiological Exams on Admission:  Dg Chest 2 View  Result Date: 01/06/2017 CLINICAL DATA:  61 year old female with cough and fever since Thursday EXAM: CHEST  2 VIEW COMPARISON:  Prior chest x-ray 07/17/2016 FINDINGS: Pulmonary hyperinflation with extensive bronchitic changes and mild interstitial prominence appears similar compared to prior. However, there is slightly increased pulmonary vascular congestion on today's examination. Cardiac and mediastinal contours are within normal limits. Atherosclerotic calcification present in the transverse aorta. No pleural effusion or pneumothorax. No focal airspace consolidation. No acute osseous abnormality. IMPRESSION: 1. Increased pulmonary vascular congestion bordering on mild interstitial edema. 2. Background hyperinflation and chronic bronchitic changes is similar compared to prior. 3.  Aortic  Atherosclerosis (ICD10-170.0) Electronically Signed   By: Malachy Moan M.D.   On: 01/06/2017 12:56    EKG: Independently reviewed. Sinus rhythm with left atrial enlargement.  Assessment/Plan: Principal Problem:   Acute respiratory failure with hypoxia (HCC) Active Problems:   Hypertension   Anxiety   COPD with acute exacerbation (HCC)   Acid reflux    This patient was discussed with the ED physician, including pertinent vitals, physical exam findings, labs, and imaging.  We also discussed care given by the ED provider.  #1 acute respiratory failure with hypoxia  Admit - I anticipate he'll will take at least 2 overnights to improve the patient's respiration  Oxygen therapy #2 COPD with exacerbation  Antibiotics: Azithro Albuterol nebs every 6 scheduled with albuterol every 2 when necessary Continue inhaled steroids and LA bronchodilator Prednisone 60 mg daily by mouth Mucinex #3 hypertension  Continue antihypertensives #4 anxiety  Patient on Xanax at home #5 acid reflux  Continue acid reducer  DVT prophylaxis: Lovenox Consultants: None Code Status: Full Family Communication: None  Disposition Plan: Patient should be able to return home following admission   Levie Heritage, DO [facility] Pager [phone]  If 7PM-7AM, please contact night-coverage [email or web address] Password [email or web address]

## 2017-01-07 DIAGNOSIS — J9601 Acute respiratory failure with hypoxia: Secondary | ICD-10-CM

## 2017-01-07 DIAGNOSIS — J441 Chronic obstructive pulmonary disease with (acute) exacerbation: Principal | ICD-10-CM

## 2017-01-07 DIAGNOSIS — F419 Anxiety disorder, unspecified: Secondary | ICD-10-CM

## 2017-01-07 MED ORDER — HYDROCODONE-HOMATROPINE 5-1.5 MG/5ML PO SYRP
5.0000 mL | ORAL_SOLUTION | ORAL | Status: DC | PRN
  Administered 2017-01-07 – 2017-01-08 (×3): 5 mL via ORAL
  Filled 2017-01-07 (×3): qty 5

## 2017-01-07 MED ORDER — ALBUTEROL SULFATE (2.5 MG/3ML) 0.083% IN NEBU
2.5000 mg | INHALATION_SOLUTION | Freq: Four times a day (QID) | RESPIRATORY_TRACT | Status: DC
  Administered 2017-01-07: 2.5 mg via RESPIRATORY_TRACT
  Filled 2017-01-07: qty 3

## 2017-01-07 MED ORDER — ALBUTEROL SULFATE (2.5 MG/3ML) 0.083% IN NEBU
2.5000 mg | INHALATION_SOLUTION | RESPIRATORY_TRACT | Status: DC
  Administered 2017-01-07 – 2017-01-08 (×7): 2.5 mg via RESPIRATORY_TRACT
  Filled 2017-01-07 (×7): qty 3

## 2017-01-07 MED ORDER — METHYLPREDNISOLONE SODIUM SUCC 40 MG IJ SOLR
40.0000 mg | Freq: Four times a day (QID) | INTRAMUSCULAR | Status: DC
  Administered 2017-01-07 – 2017-01-09 (×9): 40 mg via INTRAVENOUS
  Filled 2017-01-07 (×9): qty 1

## 2017-01-07 MED ORDER — DEXTROSE 5 % IV SOLN
500.0000 mg | INTRAVENOUS | Status: DC
  Administered 2017-01-07 – 2017-01-09 (×3): 500 mg via INTRAVENOUS
  Filled 2017-01-07 (×4): qty 500

## 2017-01-07 NOTE — Plan of Care (Signed)
Problem: Respiratory: Goal: Verbalizations of increased ease of respirations will increase Outcome: Progressing Pt O2 saturation WNL with 1L O2 nasal cannula.  Will encourage ambulation with awakening.

## 2017-01-07 NOTE — H&P (Signed)
PROGRESS NOTE    Penny HolmesCarol W Belisle  RUE:[id number] DOB: 12/17/1955 DOA: 01/06/2017 PCP: Benita StabileHall, [name], MD     Brief Narrative:  Penny HolmesCarol W Manning is a 61 y.o. female with a history of hypertension, COPD, anxiety, acid reflux, depression,  presents with 5 days of worsening shortness of breath, worse with exertion and improved with rest. She was admitted for COPD exacerbation and was given oral predisone and neb Tx.  She has significant coughs. Does have GERD on BID PPI compliantly.   Assessment & Plan:   Principal Problem:   Acute respiratory failure with hypoxia (HCC) Active Problems:   Hypertension   Anxiety   COPD with acute exacerbation (HCC)   Acid reflux   1. COPD Exacerbation:  Will give IV Steroid, increase nebs to Q4 hours RTC.  OK to give slight hycodan syrup.  She was encouraged to quit cigarettes.  She is now smoking only 2 cigarettes per day. 2. HTN:  Continue with anti HTNs. 3. Anxiety:  Will continue with Benzo at home. 4. GERD:  Important to Tx given her bronchospasm.   DVT prophylaxis: Lovenox. Code Status: None.  Family Communication: None.  Disposition Plan: Home.   Consultants:   None.   Procedures:   None  Antimicrobials: Anti-infectives    Start     Dose/Rate Route Frequency Ordered Stop   01/07/17 1000  azithromycin (ZITHROMAX) 500 mg in dextrose 5 % 250 mL IVPB     500 mg 250 mL/hr over 60 Minutes Intravenous Every 24 hours 01/07/17 0955     01/06/17 1315  azithromycin (ZITHROMAX) tablet 500 mg     500 mg Oral  Once 01/06/17 1305 01/06/17 1323       Subjective:  "when can I go home"   Objective: Vitals:   01/06/17 2200 01/07/17 0555 01/07/17 0747 01/07/17 0906  BP: 139/64 120/78    Pulse: 92 80    Resp: 18 18    Temp: 98.1 F (36.7 C) 97.9 F (36.6 C)    TempSrc: Oral Oral    SpO2: 95% 97% 93% 93%  Weight:      Height:        Intake/Output Summary (Last 24 hours) at 01/07/17 1101 Last data filed at 01/07/17 0800  Gross per 24 hour    Intake              240 ml  Output                0 ml  Net              240 ml   Filed Weights   01/06/17 1208 01/06/17 1829  Weight: 59 kg (130 lb) 60.8 kg (134 lb)    Examination:  General exam: Appears calm and comfortable  Respiratory system: tight wheezing bilaterally.  No cough.  Cardiovascular system: S1 & S2 heard, RRR. No JVD, murmurs, rubs, gallops or clicks. No pedal edema. Gastrointestinal system: Abdomen is nondistended, soft and nontender. No organomegaly or masses felt. Normal bowel sounds heard. Central nervous system: Alert and oriented. No focal neurological deficits. Extremities: Symmetric 5 x 5 power. Skin: No rashes, lesions or ulcers Psychiatry: Judgement and insight appear normal. Mood & affect appropriate.   Data Reviewed: I have personally reviewed following labs and imaging studies  CBC:  Recent Labs Lab 01/06/17 1312  WBC 10.1  NEUTROABS 6.3  HGB 12.7  HCT 39.4  MCV 93.1  PLT 292   Basic Metabolic Panel:  Recent Labs Lab 01/06/17 1312  NA 144  K 4.8  CL 106  CO2 30  GLUCOSE 104*  BUN 13  CREATININE 0.65  CALCIUM 10.0   GFR: Estimated Creatinine Clearance: 61.9 mL/min (by C-G formula based on SCr of 0.65 mg/dL). Liver Function Tests:  Recent Labs Lab 01/06/17 1312  AST 18  ALT 24  ALKPHOS 92  BILITOT 0.5  PROT 7.3  ALBUMIN 3.7    Recent Labs Lab 01/06/17 1312  TROPONINI <0.03   Radiology Studies: Dg Chest 2 View  Result Date: 01/06/2017 CLINICAL DATA:  61 year old female with cough and fever since Thursday EXAM: CHEST  2 VIEW COMPARISON:  Prior chest x-ray 07/17/2016 FINDINGS: Pulmonary hyperinflation with extensive bronchitic changes and mild interstitial prominence appears similar compared to prior. However, there is slightly increased pulmonary vascular congestion on today's examination. Cardiac and mediastinal contours are within normal limits. Atherosclerotic calcification present in the transverse aorta. No  pleural effusion or pneumothorax. No focal airspace consolidation. No acute osseous abnormality. IMPRESSION: 1. Increased pulmonary vascular congestion bordering on mild interstitial edema. 2. Background hyperinflation and chronic bronchitic changes is similar compared to prior. 3.  Aortic Atherosclerosis (ICD10-170.0) Electronically Signed   By: Malachy MoanHeath  McCullough M.D.   On: 01/06/2017 12:56    Scheduled Meds: . albuterol  2.5 mg Nebulization Q4H  . atorvastatin  20 mg Oral q1800  . enoxaparin (LOVENOX) injection  40 mg Subcutaneous Q24H  . guaiFENesin  600 mg Oral BID  . loratadine  10 mg Oral QHS  . methylPREDNISolone (SOLU-MEDROL) injection  40 mg Intravenous Q6H  . mometasone-formoterol  2 puff Inhalation BID  . pantoprazole  40 mg Oral BID  . sertraline  100 mg Oral QPM  . temazepam  15 mg Oral QHS  . tiotropium  18 mcg Inhalation Daily   Continuous Infusions: . azithromycin       LOS: 1 day   Aydia Maj, MD FACP Hospitalist.   If 7PM-7AM, please contact night-coverage [email or web address] Password [email or web address] 01/07/2017, 11:01 AM

## 2017-01-08 LAB — HIV ANTIBODY (ROUTINE TESTING W REFLEX): HIV SCREEN 4TH GENERATION: NONREACTIVE

## 2017-01-08 MED ORDER — ALBUTEROL SULFATE (2.5 MG/3ML) 0.083% IN NEBU
2.5000 mg | INHALATION_SOLUTION | Freq: Four times a day (QID) | RESPIRATORY_TRACT | Status: DC
  Administered 2017-01-08 – 2017-01-09 (×3): 2.5 mg via RESPIRATORY_TRACT
  Filled 2017-01-08 (×3): qty 3

## 2017-01-08 NOTE — Progress Notes (Signed)
PROGRESS NOTE    Penny HolmesCarol W Manning  WUJ:[id number] DOB: 01/03/1956 DOA: 01/06/2017 PCP: Benita StabileHall, [name], MD    Brief Narrative:  Penny MemosCarol W McNeilis a 61 y.o.femalewith a history of hypertension, COPD, anxiety, acid reflux, depression,  presents with 5 days of worsening shortness of breath, worse with exertion and improved with rest. She was admitted for COPD exacerbation and was given oral predisone and neb Tx.  She has significant coughs. Does have GERD on BID PPI compliantly. She was started on more intensive neb Tx regimen, and was given IV Steroids.  She has improved.   Assessment & Plan:    1. COPD Exacerbation:  Will give IV Steroid, increase nebs to Q4 hours RTC.  OK to give slight hycodan syrup.  She was encouraged to quit cigarettes.  She is now smoking only 2 cigarettes per day.  She is doing better, but is not ready for discharge as yet.  She is still wheezing moderately.  2. HTN:  Continue with anti HTNs. 3      Anxiety:  Will continue with Benzo at home. 4       GERD:  Important to Tx given her bronchospasm.   1.   DVT prophylaxis: subQ Heparin.  Code Status: FULL CODE.  Family Communication: none.  Disposition Plan: Home.   Consultants:   None.   Procedures:   None.   Antimicrobials: Anti-infectives    Start     Dose/Rate Route Frequency Ordered Stop   01/07/17 1000  azithromycin (ZITHROMAX) 500 mg in dextrose 5 % 250 mL IVPB     500 mg 250 mL/hr over 60 Minutes Intravenous Every 24 hours 01/07/17 0955     01/06/17 1315  azithromycin (ZITHROMAX) tablet 500 mg     500 mg Oral  Once 01/06/17 1305 01/06/17 1323       Subjective: " Can I go home today."  Objective: Vitals:   01/08/17 0720 01/08/17 0724 01/08/17 0725 01/08/17 1136  BP:      Pulse:      Resp:      Temp:      TempSrc:      SpO2: 94% 94% 94% 96%  Weight:      Height:        Intake/Output Summary (Last 24 hours) at 01/08/17 1438 Last data filed at 01/08/17 0900  Gross per 24 hour  Intake               600 ml  Output                0 ml  Net              600 ml   Filed Weights   01/06/17 1208 01/06/17 1829  Weight: 59 kg (130 lb) 60.8 kg (134 lb)    Examination:  General exam: Appears calm and comfortable  Respiratory system: Clear to auscultation. Respiratory effort normal. Cardiovascular system: S1 & S2 heard, RRR. No JVD, murmurs, rubs, gallops or clicks. No pedal edema. Gastrointestinal system: Abdomen is nondistended, soft and nontender. No organomegaly or masses felt. Normal bowel sounds heard. Central nervous system: Alert and oriented. No focal neurological deficits. Extremities: Symmetric 5 x 5 power. Skin: No rashes, lesions or ulcers Psychiatry: Judgement and insight appear normal. Mood & affect appropriate.   Data Reviewed: I have personally reviewed following labs and imaging studies  CBC:  Recent Labs Lab 01/06/17 1312  WBC 10.1  NEUTROABS 6.3  HGB 12.7  HCT  39.4  MCV 93.1  PLT 292   Basic Metabolic Panel:  Recent Labs Lab 01/06/17 1312  NA 144  K 4.8  CL 106  CO2 30  GLUCOSE 104*  BUN 13  CREATININE 0.65  CALCIUM 10.0   GFR: Estimated Creatinine Clearance: 61.9 mL/min (by C-G formula based on SCr of 0.65 mg/dL). Liver Function Tests:  Recent Labs Lab 01/06/17 1312  AST 18  ALT 24  ALKPHOS 92  BILITOT 0.5  PROT 7.3  ALBUMIN 3.7   Cardiac Enzymes:  Recent Labs Lab 01/06/17 1312  TROPONINI <0.03    Radiology Studies: No results found.  Scheduled Meds: . albuterol  2.5 mg Nebulization Q4H  . atorvastatin  20 mg Oral q1800  . enoxaparin (LOVENOX) injection  40 mg Subcutaneous Q24H  . guaiFENesin  600 mg Oral BID  . loratadine  10 mg Oral QHS  . methylPREDNISolone (SOLU-MEDROL) injection  40 mg Intravenous Q6H  . mometasone-formoterol  2 puff Inhalation BID  . pantoprazole  40 mg Oral BID  . sertraline  100 mg Oral QPM  . temazepam  15 mg Oral QHS  . tiotropium  18 mcg Inhalation Daily   Continuous  Infusions: . azithromycin Stopped (01/08/17 0930)     LOS: 2 days   Jema Deegan, MD FACP Hospitalist.   If 7PM-7AM, please contact night-coverage [email or web address] Password [email or web address] 01/08/2017, 2:38 PM

## 2017-01-09 MED ORDER — PREDNISONE 10 MG (21) PO TBPK: ORAL_TABLET | ORAL | 0 refills | Status: DC

## 2017-01-09 MED ORDER — LEVOFLOXACIN 750 MG PO TABS: 750.0000 mg | ORAL_TABLET | Freq: Every day | ORAL | 0 refills | Status: AC

## 2017-01-09 NOTE — Care Management Note (Signed)
Case Management Note  Patient Details  Name: Penny HolmesCarol W Manning MRN: [id number] Date of Birth: 05/08/1956  Subjective/Objective:                  Admitted with COPD. Chart reviewed for CM needs. Pt from home, ind with ADL's, employed, has insurance and PCP.   Action/Plan: DC home today with self care. No needs noted prior to DC.   Expected Discharge Date:  01/09/17               Expected Discharge Plan:  Home/Self Care  In-House Referral:  NA  Discharge planning Services  CM Consult  Post Acute Care Choice:  NA Choice offered to:  NA  Status of Service:  Completed, signed off   Malcolm MetroChildress, Salwa Bai Demske, RN 01/09/2017, 12:28 PM

## 2017-01-09 NOTE — Progress Notes (Addendum)
Patient states understanding of discharge instructions, prescription given. Patient given education and handouts on smoking cessation, statea she will not smoke anymore. Patient states understanding of prednisone dose pack

## 2017-01-09 NOTE — Discharge Summary (Signed)
Physician Discharge Summary  [name] MWN:[id number] DOB: [birth year] DOA: 01/06/2017  PCP: Benita Stabile, MD  Admit date: 01/06/2017 Discharge date: 01/09/2017  Admitted From: Home.  Disposition:  Home.   Recommendations for Outpatient Follow-up:  1. Follow up with PCP in 1-2 weeks  Home Health: None.  Equipment/Devices: None.  Discharge Condition: No wheezing.  Minimal coughs.  No SOB and no hypoxia.  CODE STATUS: FULL CODE.  Diet recommendation: cardiac.   Brief/Interim Summary: Patient was admitted for COPD exacerbation by Dr Adrian Blackwater on January 06, 2017.  As per his H and P: " Penny Manning is a 61 y.o. female with a history of hypertension, COPD, anxiety, acid reflux, depression. Patient presents with 5 days of worsening shortness of breath, worse with exertion and improved with rest. Has been using her albuterol inhaler every 6 hours, which is helpful for short period of time. No other provoking or palliating factors. Denies chest pain, orthopnea. Does have cough with normal sputum production.  HOSPITAL COURSE:  Patient was admitted and given Prednisone and neb by Dr Adrian Blackwater.  When I saw her, she was having significant wheezing.  Given she has not been on Prednisone outpatient, she was given IV Steroids, along with IV Zithromax, some coughs medicine, and supplement oxygen.  She improved, and is now ready for discharge.  She did not have PNA on her CXR, but on exam, she has some crackles on the right mid lung field.  She will therefore be discharged on Levaquin for another 5 days, along with rapid taper steroid pak.  She will see her PCP next week.  She expressed that she may switch to another provider.  Thank you for allowing me to participate in her care.  Good day.    Discharge Diagnoses:  Principal Problem:   Acute respiratory failure with hypoxia (HCC) Active Problems:   Hypertension   Anxiety   COPD with acute exacerbation (HCC)   Acid reflux    Discharge  Instructions  Discharge Instructions    Diet - low sodium heart healthy    Complete by:  As directed    Discharge instructions    Complete by:  As directed    Don't smoke.  Finish your medication as prescribe.  Follow up with your doctor next week.   Increase activity slowly    Complete by:  As directed      Allergies as of 01/09/2017   No Known Allergies     Medication List    STOP taking these medications   acetaminophen 500 MG tablet Commonly known as:  TYLENOL   meclizine 25 MG tablet Commonly known as:  ANTIVERT     TAKE these medications   ALPRAZolam 0.5 MG tablet Commonly known as:  XANAX Take 1 tablet by mouth 2 (two) times daily as needed for anxiety.   atorvastatin 20 MG tablet Commonly known as:  LIPITOR Take 20 mg by mouth daily.   buPROPion 150 MG 24 hr tablet Commonly known as:  WELLBUTRIN XL Take 150 mg by mouth daily.   levocetirizine 5 MG tablet Commonly known as:  XYZAL Take 5 mg by mouth every evening.   levofloxacin 750 MG tablet Commonly known as:  LEVAQUIN Take 1 tablet (750 mg total) by mouth daily.   pantoprazole 40 MG tablet Commonly known as:  PROTONIX Take 1 tablet by mouth 2 (two) times daily.   predniSONE 10 MG (21) Tbpk tablet Commonly known as:  STERAPRED UNI-PAK 21 TAB Use  as directed, and take with food.   PROAIR HFA 108 (90 Base) MCG/ACT inhaler Generic drug:  albuterol Inhale 1-2 puffs into the lungs every 6 (six) hours as needed for wheezing.   sertraline 100 MG tablet Commonly known as:  ZOLOFT Take 100 mg by mouth every evening.   SYMBICORT 160-4.5 MCG/ACT inhaler Generic drug:  budesonide-formoterol Inhale 2 puffs into the lungs 2 (two) times daily.   temazepam 15 MG capsule Commonly known as:  RESTORIL Take 15 mg by mouth at bedtime.       No Known Allergies  Consultations:  None.    Procedures/Studies: Dg Chest 2 View  Result Date: 01/06/2017 CLINICAL DATA:  61 year old female with cough and  fever since Thursday EXAM: CHEST  2 VIEW COMPARISON:  Prior chest x-ray 07/17/2016 FINDINGS: Pulmonary hyperinflation with extensive bronchitic changes and mild interstitial prominence appears similar compared to prior. However, there is slightly increased pulmonary vascular congestion on today's examination. Cardiac and mediastinal contours are within normal limits. Atherosclerotic calcification present in the transverse aorta. No pleural effusion or pneumothorax. No focal airspace consolidation. No acute osseous abnormality. IMPRESSION: 1. Increased pulmonary vascular congestion bordering on mild interstitial edema. 2. Background hyperinflation and chronic bronchitic changes is similar compared to prior. 3.  Aortic Atherosclerosis (ICD10-170.0) Electronically Signed   By: Malachy Moan M.D.   On: 01/06/2017 12:56       Subjective:  I am ready to go home.   Discharge Exam: Vitals:   01/08/17 1946 01/08/17 2100  BP:  122/81  Pulse: 82 84  Resp: 16 18  Temp:     Vitals:   01/08/17 2100 01/09/17 0853 01/09/17 0902 01/09/17 0904  BP: 122/81     Pulse: 84     Resp: 18     Temp:      TempSrc:      SpO2: 97% (!) 87% 97% 97%  Weight:      Height:        General: Pt is alert, awake, not in acute distress Cardiovascular: RRR, S1/S2 +, no rubs, no gallops Respiratory: CTA bilaterally, no wheezing, no rhonchi Abdominal: Soft, NT, ND, bowel sounds + Extremities: no edema, no cyanosis    The results of significant diagnostics from this hospitalization (including imaging, microbiology, ancillary and laboratory) are listed below for reference.     Microbiology: No results found for this or any previous visit (from the past 240 hour(s)).   Labs: BNP (last 3 results)  Recent Labs  01/06/17 1312  BNP 39.0   Basic Metabolic Panel:  Recent Labs Lab 01/06/17 1312  NA 144  K 4.8  CL 106  CO2 30  GLUCOSE 104*  BUN 13  CREATININE 0.65  CALCIUM 10.0   Liver Function  Tests:  Recent Labs Lab 01/06/17 1312  AST 18  ALT 24  ALKPHOS 92  BILITOT 0.5  PROT 7.3  ALBUMIN 3.7   CBC:  Recent Labs Lab 01/06/17 1312  WBC 10.1  NEUTROABS 6.3  HGB 12.7  HCT 39.4  MCV 93.1  PLT 292   Cardiac Enzymes:  Recent Labs Lab 01/06/17 1312  TROPONINI <0.03   BNP:  Anemia work up No results for input(s): VITAMINB12, FOLATE, FERRITIN, TIBC, IRON, RETICCTPCT in the last 72 hours. Urinalysis    Component Value Date/Time   COLORURINE YELLOW 09/10/2007 1023   APPEARANCEUR CLEAR 09/10/2007 1023   LABSPEC 1.005 09/10/2007 1023   PHURINE 5.5 09/10/2007 1023   GLUCOSEU NEGATIVE 09/10/2007 1023   HGBUR  NEGATIVE 09/10/2007 1023   BILIRUBINUR NEGATIVE 09/10/2007 1023   KETONESUR NEGATIVE 09/10/2007 1023   PROTEINUR NEGATIVE 09/10/2007 1023   UROBILINOGEN 0.2 09/10/2007 1023   NITRITE NEGATIVE 09/10/2007 1023   LEUKOCYTESUR MODERATE (A) 09/10/2007 1023    Time coordinating discharge: Over 30 minutes SIGNED:  Houston SirenLE,Keiondra Brookover, MD FACP [facility] 01/09/2017, 11:14 AM   If 7PM-7AM, please contact night-coverage [email or web address] Password [email or web address]

## 2017-03-24 IMAGING — MG DIGITAL SCREENING BILATERAL MAMMOGRAM WITH CAD
4 series · 4 of 4 positions shown · non-contrast
Comparison: Previous exam(s).

CLINICAL DATA: Screening.

EXAM:
DIGITAL SCREENING BILATERAL MAMMOGRAM WITH CAD

[L CC]
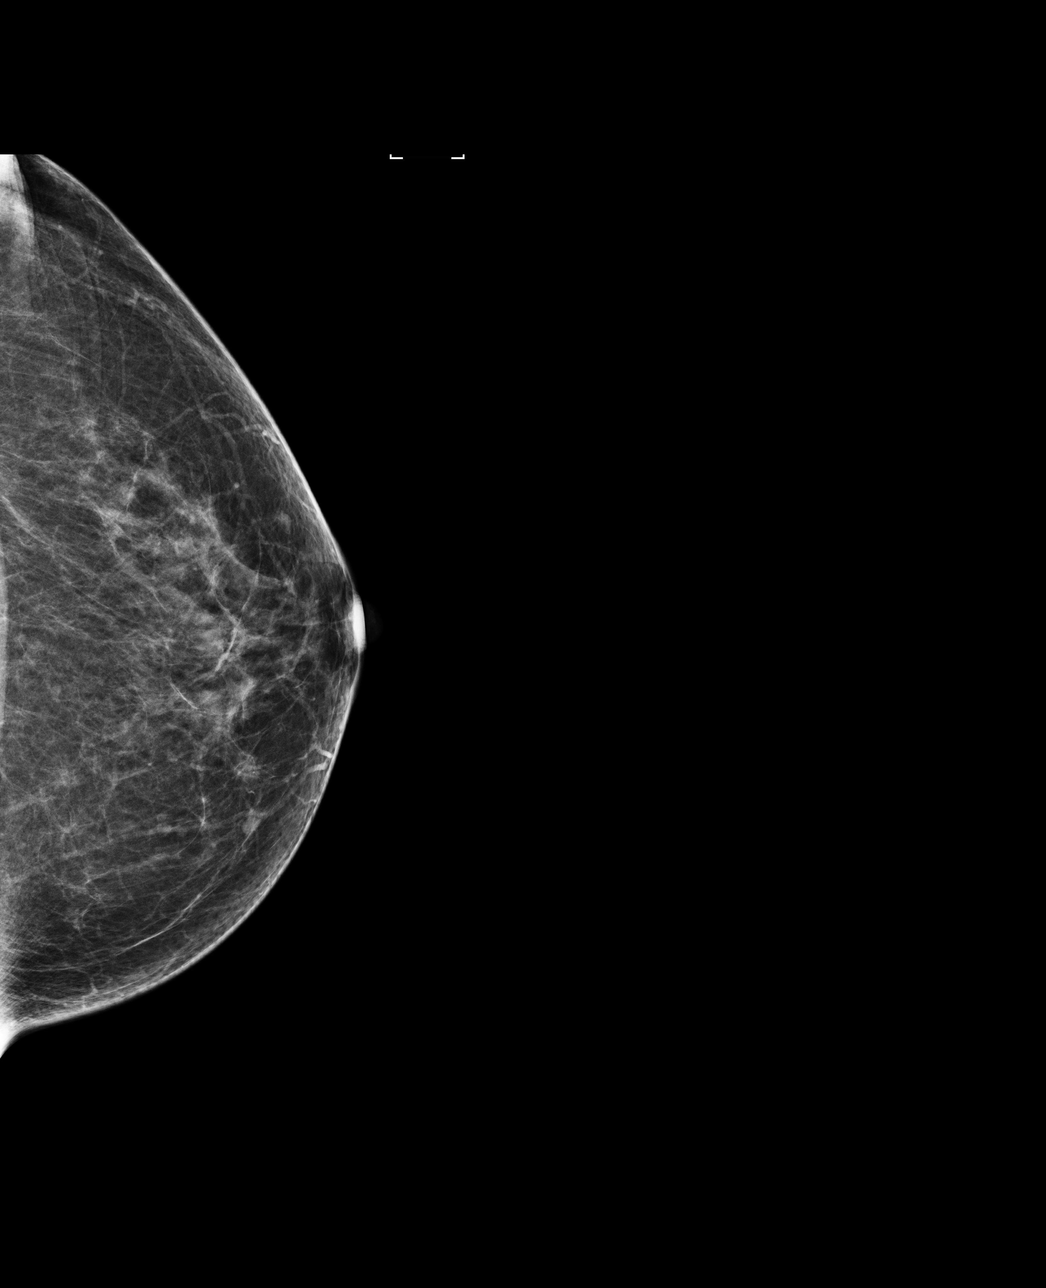

[R MLO]
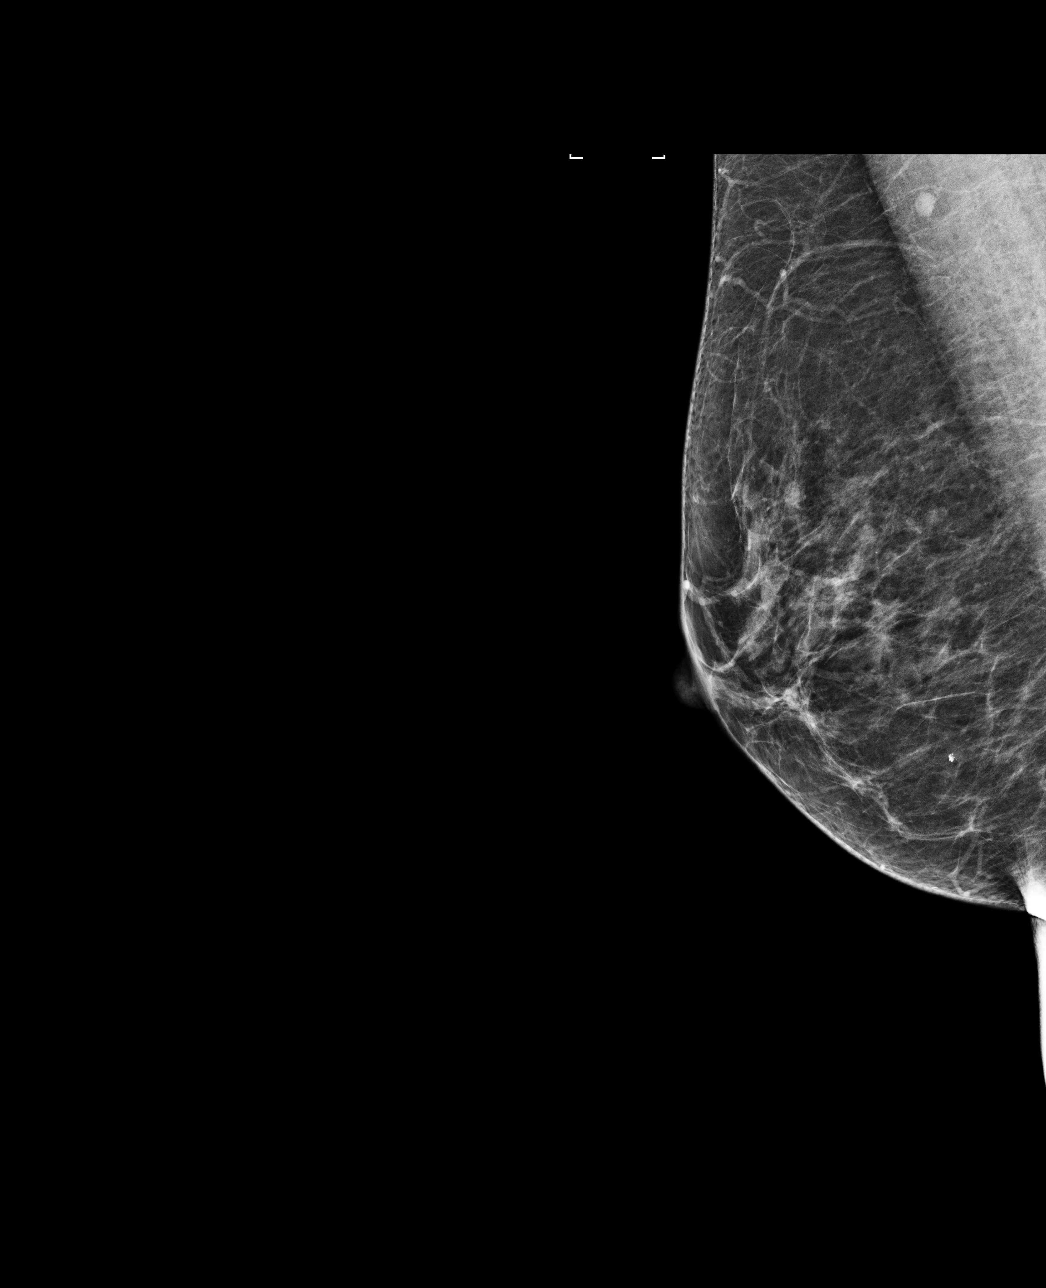

[R CC]
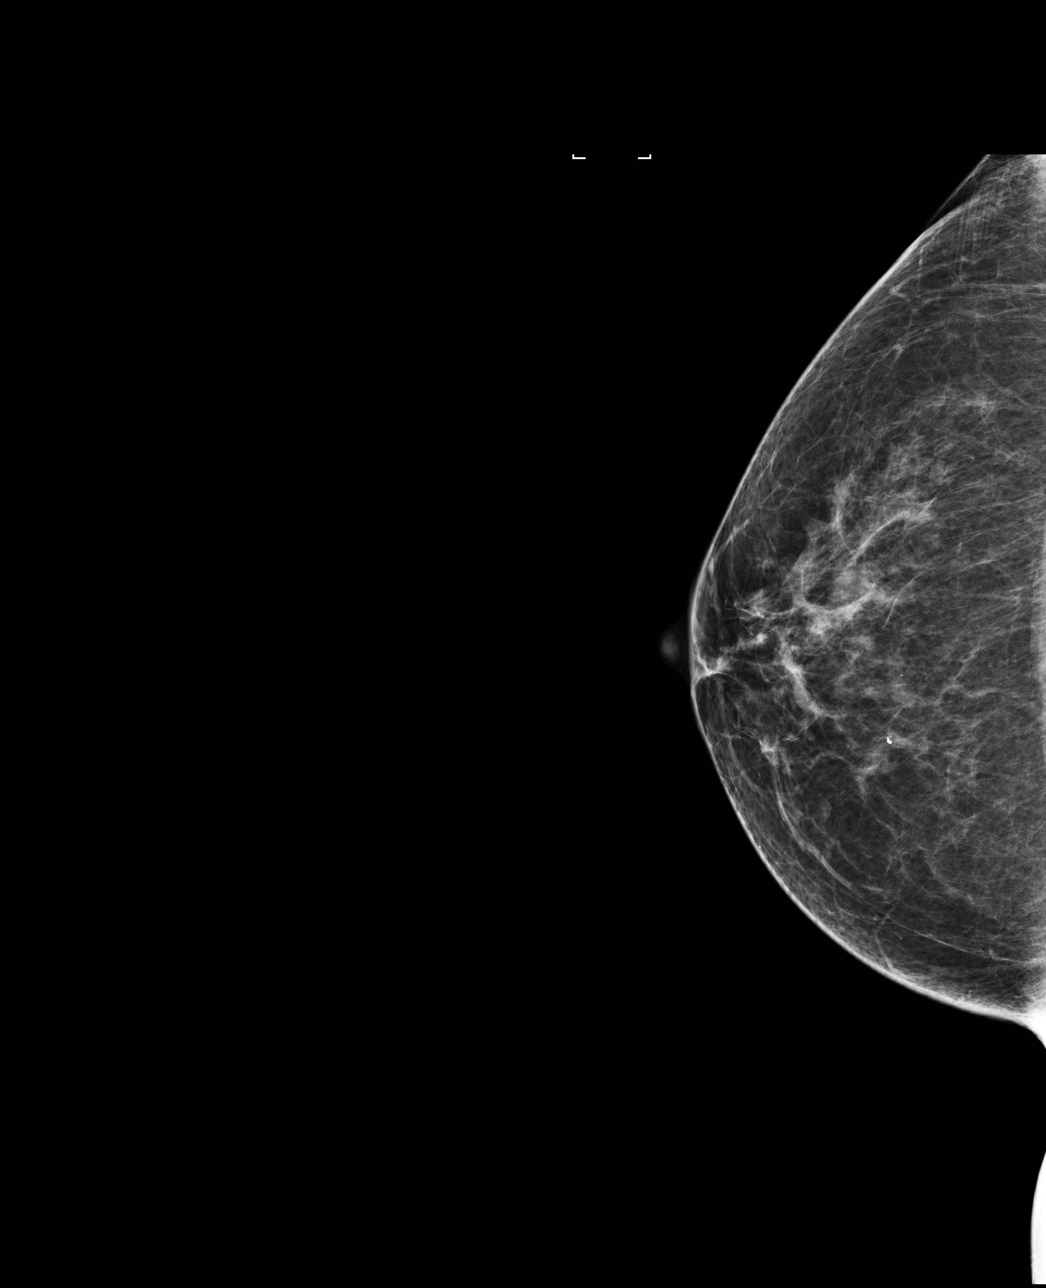

[L MLO]
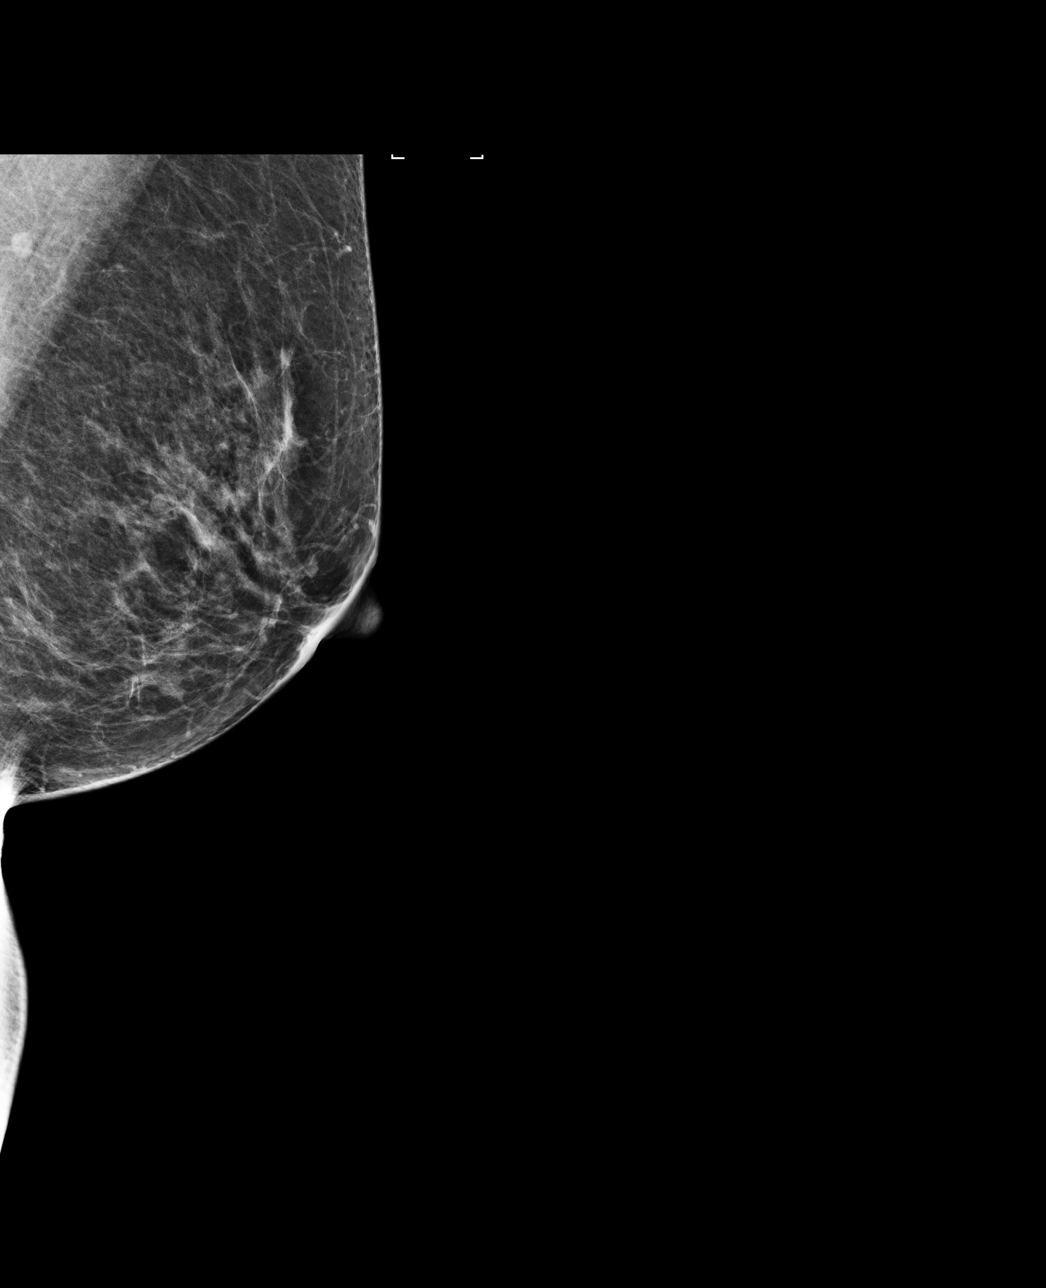

[4 of 4 positions shown; findings below may reference images not displayed]

ACR Breast Density Category b: There are scattered areas of
fibroglandular density.
FINDINGS: There are no findings suspicious for malignancy. Images were
processed with CAD.
IMPRESSION: No mammographic evidence of malignancy. A result letter of this
screening mammogram will be mailed directly to the patient.

RECOMMENDATION:
Screening mammogram in one year. (Code:AS-G-LCT)

BI-RADS CATEGORY  1: Negative.

## 2017-10-09 ENCOUNTER — Encounter (HOSPITAL_COMMUNITY): Payer: Self-pay | Admitting: Emergency Medicine

## 2017-10-09 ENCOUNTER — Emergency Department (HOSPITAL_COMMUNITY): Payer: Self-pay

## 2017-10-09 ENCOUNTER — Emergency Department (HOSPITAL_COMMUNITY)
Admission: EM | Admit: 2017-10-09 | Discharge: 2017-10-09 | Disposition: A | Discharge status: Home / Self Care | Payer: Self-pay | Attending: Emergency Medicine | Admitting: Emergency Medicine

## 2017-10-09 ENCOUNTER — Other Ambulatory Visit: Payer: Self-pay

## 2017-10-09 DIAGNOSIS — I10 Essential (primary) hypertension: Secondary | ICD-10-CM | POA: Insufficient documentation

## 2017-10-09 DIAGNOSIS — F17200 Nicotine dependence, unspecified, uncomplicated: Secondary | ICD-10-CM | POA: Insufficient documentation

## 2017-10-09 DIAGNOSIS — J441 Chronic obstructive pulmonary disease with (acute) exacerbation: Secondary | ICD-10-CM | POA: Insufficient documentation

## 2017-10-09 LAB — CBC WITH DIFFERENTIAL/PLATELET
Basophils Absolute: 0 10*3/uL (ref 0.0–0.1)
Basophils Relative: 0 %
EOS ABS: 0.7 10*3/uL (ref 0.0–0.7)
EOS PCT: 9 %
HCT: 44 % (ref 36.0–46.0)
Hemoglobin: 14.3 g/dL (ref 12.0–15.0)
LYMPHS ABS: 2.7 10*3/uL (ref 0.7–4.0)
Lymphocytes Relative: 34 %
MCH: 30.2 pg (ref 26.0–34.0)
MCHC: 32.5 g/dL (ref 30.0–36.0)
MCV: 92.8 fL (ref 78.0–100.0)
MONO ABS: 0.8 10*3/uL (ref 0.1–1.0)
Monocytes Relative: 10 %
Neutro Abs: 3.8 10*3/uL (ref 1.7–7.7)
Neutrophils Relative %: 47 %
PLATELETS: 287 10*3/uL (ref 150–400)
RBC: 4.74 MIL/uL (ref 3.87–5.11)
RDW: 12.9 % (ref 11.5–15.5)
WBC: 8.1 10*3/uL (ref 4.0–10.5)

## 2017-10-09 LAB — BASIC METABOLIC PANEL
Anion gap: 6 (ref 5–15)
BUN: 20 mg/dL (ref 6–20)
CHLORIDE: 105 mmol/L (ref 101–111)
CO2: 30 mmol/L (ref 22–32)
Calcium: 9.3 mg/dL (ref 8.9–10.3)
Creatinine, Ser: 0.78 mg/dL (ref 0.44–1.00)
GFR calc Af Amer: >60 mL/min (ref >60)
GFR calc non Af Amer: >60 mL/min (ref >60)
Glucose, Bld: 92 mg/dL (ref 65–99)
Potassium: 4.5 mmol/L (ref 3.5–5.1)
SODIUM: 141 mmol/L (ref 135–145)

## 2017-10-09 LAB — I-STAT TROPONIN, ED: TROPONIN I, POC: 0 ng/mL (ref 0.00–0.08)

## 2017-10-09 MED ORDER — AZITHROMYCIN 250 MG PO TABS: 250.0000 mg | ORAL_TABLET | Freq: Every day | ORAL | 0 refills | Status: AC

## 2017-10-09 MED ORDER — IPRATROPIUM-ALBUTEROL 0.5-2.5 (3) MG/3ML IN SOLN
3.0000 mL | Freq: Once | RESPIRATORY_TRACT | Status: AC
  Administered 2017-10-09: 3 mL via RESPIRATORY_TRACT
  Filled 2017-10-09: qty 3

## 2017-10-09 MED ORDER — PREDNISONE 20 MG PO TABS: 40.0000 mg | ORAL_TABLET | Freq: Every day | ORAL | 0 refills | Status: AC

## 2017-10-09 MED ORDER — ALBUTEROL SULFATE HFA 108 (90 BASE) MCG/ACT IN AERS
2.0000 | INHALATION_SPRAY | Freq: Once | RESPIRATORY_TRACT | Status: AC
  Administered 2017-10-09: 2 via RESPIRATORY_TRACT
  Filled 2017-10-09: qty 6.7

## 2017-10-09 MED ORDER — ALBUTEROL SULFATE HFA 108 (90 BASE) MCG/ACT IN AERS: 1.0000 | INHALATION_SPRAY | Freq: Four times a day (QID) | RESPIRATORY_TRACT | 0 refills | Status: AC | PRN

## 2017-10-09 NOTE — ED Triage Notes (Signed)
HX COPD, Onset last week, has been using inhalers more frequent without relief, today Increased SOB, decreased energy.

## 2017-10-09 NOTE — ED Provider Notes (Signed)
Emergency Department Provider Note   I have reviewed the triage vital signs and the nursing notes.   HISTORY  Chief Complaint Shortness of Breath   HPI Penny Manning is a 62 y.o. female with PMH of COPD, HTN, and tobacco use presents to the emergency department for evaluation of worsening shortness of breath and coughing.  Symptoms have worsened over the past 2 weeks.  Patient states she is been under more stress at home recently after having to raise her 2 grandchildren.  She states that their father got out of jail 2 weeks ago and thinks it been more stressful at home recently.  She reports feeling safe there.  She states that because of her stress she feels that she is been more short of breath and having coughing.  She denies any fevers or chills.  She has some chest discomfort but states it is mainly with coughing.  No hemoptysis.  No sick contacts.  She continues to smoke occasional cigarettes but states she typically just takes 1 or 2 puffs without finishing a cigarette.   Past Medical History:  Diagnosis Date  . Acid reflux   . Anxiety   . Asthma   . COPD (chronic obstructive pulmonary disease) (HCC)   . Depression   . Hypertension   . Vertigo     Patient Active Problem List   Diagnosis Date Noted  . Acute respiratory failure with hypoxia (HCC) 01/06/2017  . Hypertension   . Anxiety   . COPD with acute exacerbation (HCC)   . Acid reflux     Past Surgical History:  Procedure Laterality Date  . ABDOMINAL HYSTERECTOMY    . FOOT SURGERY Right   . NERVE, TENDON AND ARTERY REPAIR Left 02/25/2015   Procedure: LEFT HAND EXPLORATION WITH NERVE, TENDON AND ARTERY REPAIR;  Surgeon: Betha Loa, MD;  Location: [facility];  Service: Orthopedics;  Laterality: Left;    Current Outpatient Rx  . Order #: [id number] Class: Historical Med  . Order #: [id number] Class: Historical Med  . Order #: [id number] Class: Historical Med  . Order #: [id number] Class: Historical Med   . Order #: [id number] Class: Historical Med  . Order #: [id number] Class: Historical Med  . Order #: [id number] Class: Historical Med  . Order #: [id number] Class: Print  . Order #: [id number] Class: Print  . Order #: [id number] Class: Print    Allergies Patient has no known allergies.  No family history on file.  Social History Social History   Tobacco Use  . Smoking status: Current Every Day Smoker    Packs/day: 0.25    Last attempt to quit: 02/10/2015    Years since quitting: 2.6  . Smokeless tobacco: Never Used  Substance Use Topics  . Alcohol use: Yes    Comment: social  . Drug use: No    Review of Systems  Constitutional: No fever/chills Eyes: No visual changes. ENT: No sore throat. Cardiovascular: Positive chest pain with cough.  Respiratory: Positive shortness of breath and cough.  Gastrointestinal: No abdominal pain.  No nausea, no vomiting.  No diarrhea.  No constipation. Genitourinary: Negative for dysuria. Musculoskeletal: Negative for back pain. Skin: Negative for rash. Neurological: Negative for headaches, focal weakness or numbness.  10-point ROS otherwise negative.  ____________________________________________   PHYSICAL EXAM:  VITAL SIGNS: ED Triage Vitals  Enc Vitals Group     BP 10/09/17 1122 (!) 160/87     Pulse Rate 10/09/17 1122 78     Resp 10/09/17 1122 (!) 22  Temp 10/09/17 1122 97.7 F (36.5 C)     Temp Source 10/09/17 1122 Oral     SpO2 10/09/17 1122 93 %     Weight 10/09/17 1127 138 lb (62.6 kg)     Height 10/09/17 1127  (1.6 m)     Pain Score 10/09/17 1127 0   Constitutional: Alert and oriented. Well appearing and in no acute distress. Eyes: Conjunctivae are normal. Head: Atraumatic. Nose: No congestion/rhinnorhea. Mouth/Throat: Mucous membranes are moist.  Neck: No stridor.  Cardiovascular: Normal rate, regular rhythm. Good peripheral circulation. Grossly normal heart sounds.   Respiratory: Normal respiratory effort.  No  retractions. Lungs with bilateral end-expiratory wheezing.  Gastrointestinal: Soft and nontender. No distention.  Musculoskeletal: No lower extremity tenderness nor edema. No gross deformities of extremities. Neurologic:  Normal speech and language. No gross focal neurologic deficits are appreciated.  Skin:  Skin is warm, dry and intact. No rash noted.  ____________________________________________   LABS (all labs ordered are listed, but only abnormal results are displayed)  Labs Reviewed  BASIC METABOLIC PANEL  CBC WITH DIFFERENTIAL/PLATELET  I-STAT TROPONIN, ED   ____________________________________________  EKG   EKG Interpretation  Date/Time:  Tuesday Oct 09 2017 11:30:17 EDT Ventricular Rate:  85 PR Interval:  164 QRS Duration: 84 QT Interval:  362 QTC Calculation: 430 R Axis:   70 Text Interpretation:  Normal sinus rhythm Normal ECG No STEMI.  Confirmed by Alona Bene [phone]) on 10/09/2017 2:41:02 PM       ____________________________________________  RADIOLOGY  Dg Chest 2 View  Result Date: 10/09/2017 CLINICAL DATA:  Shortness of breath EXAM: CHEST - 2 VIEW COMPARISON:  01/06/2017 FINDINGS: Cardiac shadow is stable. The lungs are mildly hyperinflated consistent with COPD. No focal infiltrate or sizable effusion is seen. No bony abnormality is seen. IMPRESSION: COPD without acute abnormality. Electronically Signed   By: Alcide Clever M.D.   On: 10/09/2017 16:11    ____________________________________________   PROCEDURES  Procedure(s) performed:   Procedures  None ____________________________________________   INITIAL IMPRESSION / ASSESSMENT AND PLAN / ED COURSE  Pertinent labs & imaging results that were available during my care of the patient were reviewed by me and considered in my medical decision making (see chart for details).  Patient presents to the emergency department with worsening shortness of breath and cough.  She has chest pain that is  present only with coughing.  She is concerned that this may be due to increased stress at home.  She endorses feeling like she is in a safe environment.  She continues to smoke occasionally.  Suspect her symptoms are most concerning for mild COPD exacerbation. Plan for Duoneb, CXR, and labs. EKG reviewed with no acute findings.   Patient feeling better after nebs. CXR reviewed with no acute findings. Troponin negative. Remaining labs reviewed with no acute findings. Will discharge home with albuterol inh, steroid, and azithromycin.   At this time, I do not feel there is any life-threatening condition present. I have reviewed and discussed all results (EKG, imaging, lab, urine as appropriate), exam findings with patient. I have reviewed nursing notes and appropriate previous records.  I feel the patient is safe to be discharged home without further emergent workup. Discussed usual and customary return precautions. Patient and family (if present) verbalize understanding and are comfortable with this plan.  Patient will follow-up with their primary care provider. If they do not have a primary care provider, information for follow-up has been provided to them. All questions  have been answered.  ____________________________________________  FINAL CLINICAL IMPRESSION(S) / ED DIAGNOSES  Final diagnoses:  COPD exacerbation (HCC)     MEDICATIONS GIVEN DURING THIS VISIT:  Medications  albuterol (PROVENTIL HFA;VENTOLIN HFA) 108 (90 Base) MCG/ACT inhaler 2 puff (has no administration in time range)  ipratropium-albuterol (DUONEB) 0.5-2.5 (3) MG/3ML nebulizer solution 3 mL (3 mLs Nebulization Given 10/09/17 1444)     NEW OUTPATIENT MEDICATIONS STARTED DURING THIS VISIT:  Discharge Medication List as of 10/09/2017  3:56 PM    START taking these medications   Details  azithromycin (ZITHROMAX) 250 MG tablet Take 1 tablet (250 mg total) by mouth daily. Take first 2 tablets together, then 1 every day until  finished., Starting Tue 10/09/2017, Print    predniSONE (DELTASONE) 20 MG tablet Take 2 tablets (40 mg total) by mouth daily for 5 days., Starting Tue 10/09/2017, Until Sun 10/14/2017, Print        Note:  This document was prepared using Dragon voice recognition software and may include unintentional dictation errors.  Alona Bene, MD Emergency Medicine    Long, Arlyss Repress, MD 10/09/17 [phone]

## 2017-10-09 NOTE — Discharge Instructions (Signed)
We believe that your symptoms are caused today by an exacerbation of your COPD, and possibly bronchitis.  Please take the prescribed medications and any medications that you have at home for your COPD.  Follow up with your doctor as recommended.  If you develop any new or worsening symptoms, including but not limited to fever, persistent vomiting, worsening shortness of breath, or other symptoms that concern you, please return to the Emergency Department immediately. ° ° °Chronic Obstructive Pulmonary Disease °Chronic obstructive pulmonary disease (COPD) is a common lung condition in which airflow from the lungs is limited. COPD is a general term that can be used to describe many different lung problems that limit airflow, including both chronic bronchitis and emphysema.  If you have COPD, your lung function will probably never return to normal, but there are measures you can take to improve lung function and make yourself feel better.  °CAUSES  °Smoking (common).   °Exposure to secondhand smoke.   °Genetic problems. °Chronic inflammatory lung diseases or recurrent infections. °SYMPTOMS  °Shortness of breath, especially with physical activity.   °Deep, persistent (chronic) cough with a large amount of thick mucus.   °Wheezing.   °Rapid breaths (tachypnea).   °Gray or bluish discoloration (cyanosis) of the skin, especially in fingers, toes, or lips.   °Fatigue.   °Weight loss.   °Frequent infections or episodes when breathing symptoms become much worse (exacerbations).   °Chest tightness. °DIAGNOSIS  °Your health care provider will take a medical history and perform a physical examination to make the initial diagnosis.  Additional tests for COPD may include:  °Lung (pulmonary) function tests. °Chest X-ray. °CT scan. °Blood tests. °TREATMENT  °Treatment available to help you feel better when you have COPD includes:  °Inhaler and nebulizer medicines. These help manage the symptoms of COPD and make your breathing more  comfortable. °Supplemental oxygen. Supplemental oxygen is only helpful if you have a low oxygen level in your blood.   °Exercise and physical activity. These are beneficial for nearly all people with COPD. Some people may also benefit from a pulmonary rehabilitation program. °HOME CARE INSTRUCTIONS  °Take all medicines (inhaled or pills) as directed by your health care provider. °Avoid over-the-counter medicines or cough syrups that dry up your airway (such as antihistamines) and slow down the elimination of secretions unless instructed otherwise by your health care provider.   °If you are a smoker, the most important thing that you can do is stop smoking. Continuing to smoke will cause further lung damage and breathing trouble. Ask your health care provider for help with quitting smoking. He or she can direct you to community resources or hospitals that provide support. °Avoid exposure to irritants such as smoke, chemicals, and fumes that aggravate your breathing. °Use oxygen therapy and pulmonary rehabilitation if directed by your health care provider. If you require home oxygen therapy, ask your health care provider whether you should purchase a pulse oximeter to measure your oxygen level at home.   °Avoid contact with individuals who have a contagious illness. °Avoid extreme temperature and humidity changes. °Eat healthy foods. Eating smaller, more frequent meals and resting before meals may help you maintain your strength. °Stay active, but balance activity with periods of rest. Exercise and physical activity will help you maintain your ability to do things you want to do. °Preventing infection and hospitalization is very important when you have COPD. Make sure to receive all the vaccines your health care provider recommends, especially the pneumococcal and influenza   vaccines. Ask your health care provider whether you need a pneumonia vaccine. °Learn and use relaxation techniques to manage stress. °Learn and  use controlled breathing techniques as directed by your health care provider. Controlled breathing techniques include:   °Pursed lip breathing. Start by breathing in (inhaling) through your nose for 1 second. Then, purse your lips as if you were going to whistle and breathe out (exhale) through the pursed lips for 2 seconds.   °Diaphragmatic breathing. Start by putting one hand on your abdomen just above your waist. Inhale slowly through your nose. The hand on your abdomen should move out. Then purse your lips and exhale slowly. You should be able to feel the hand on your abdomen moving in as you exhale.   °Learn and use controlled coughing to clear mucus from your lungs. Controlled coughing is a series of short, progressive coughs. The steps of controlled coughing are:   °Lean your head slightly forward.   °Breathe in deeply using diaphragmatic breathing.   °Try to hold your breath for 3 seconds.   °Keep your mouth slightly open while coughing twice.   °Spit any mucus out into a tissue.   °Rest and repeat the steps once or twice as needed. °SEEK MEDICAL CARE IF:  °You are coughing up more mucus than usual.   °There is a change in the color or thickness of your mucus.   °Your breathing is more labored than usual.   °Your breathing is faster than usual.   °SEEK IMMEDIATE MEDICAL CARE IF:  °You have shortness of breath while you are resting.   °You have shortness of breath that prevents you from: °Being able to talk.   °Performing your usual physical activities.   °You have chest pain lasting longer than 5 minutes.   °Your skin color is more cyanotic than usual. °You measure low oxygen saturations for longer than 5 minutes with a pulse oximeter. °MAKE SURE YOU:  °Understand these instructions. °Will watch your condition. °Will get help right away if you are not doing well or get worse. °Document Released: 03/01/2005 Document Revised: 10/06/2013 Document Reviewed: 01/16/2013 °[employer]® Patient Information ©2015  [employer]. This information is not intended to replace advice given to you by your health care provider. Make sure you discuss any questions you have with your health care provider. ° °

## 2017-10-09 NOTE — ED Triage Notes (Signed)
Pt states she is under a lot of stress, feels like anxiety is making this worse. Pt is tearful, raising two small granddaughters.

## 2019-08-04 ENCOUNTER — Other Ambulatory Visit (HOSPITAL_COMMUNITY): Payer: Self-pay | Admitting: Internal Medicine

## 2019-08-04 DIAGNOSIS — Z1231 Encounter for screening mammogram for malignant neoplasm of breast: Secondary | ICD-10-CM

## 2019-08-11 ENCOUNTER — Ambulatory Visit (HOSPITAL_COMMUNITY): Payer: Medicaid Other

## 2019-08-25 ENCOUNTER — Ambulatory Visit (HOSPITAL_COMMUNITY): Payer: Medicaid Other

## 2020-05-17 ENCOUNTER — Other Ambulatory Visit (HOSPITAL_COMMUNITY): Payer: Self-pay | Admitting: Internal Medicine

## 2020-05-17 DIAGNOSIS — Z1231 Encounter for screening mammogram for malignant neoplasm of breast: Secondary | ICD-10-CM

## 2020-05-27 ENCOUNTER — Ambulatory Visit (HOSPITAL_COMMUNITY): Payer: Medicaid Other

## 2020-06-18 ENCOUNTER — Ambulatory Visit (HOSPITAL_COMMUNITY): Payer: Medicaid Other

## 2020-10-27 LAB — COLOGUARD: COLOGUARD: NEGATIVE

## 2021-10-26 DIAGNOSIS — Z733 Stress, not elsewhere classified: Secondary | ICD-10-CM | POA: Diagnosis not present

## 2021-10-26 DIAGNOSIS — R059 Cough, unspecified: Secondary | ICD-10-CM | POA: Diagnosis not present

## 2021-10-26 DIAGNOSIS — J449 Chronic obstructive pulmonary disease, unspecified: Secondary | ICD-10-CM | POA: Diagnosis not present

## 2021-10-26 DIAGNOSIS — J069 Acute upper respiratory infection, unspecified: Secondary | ICD-10-CM | POA: Diagnosis not present

## 2022-02-07 ENCOUNTER — Other Ambulatory Visit (HOSPITAL_COMMUNITY): Payer: Self-pay | Admitting: Internal Medicine

## 2022-02-07 DIAGNOSIS — Z1231 Encounter for screening mammogram for malignant neoplasm of breast: Secondary | ICD-10-CM

## 2022-02-10 DIAGNOSIS — E782 Mixed hyperlipidemia: Secondary | ICD-10-CM | POA: Diagnosis not present

## 2022-02-10 DIAGNOSIS — R7303 Prediabetes: Secondary | ICD-10-CM | POA: Diagnosis not present

## 2022-02-13 DIAGNOSIS — H5203 Hypermetropia, bilateral: Secondary | ICD-10-CM | POA: Diagnosis not present

## 2022-02-15 ENCOUNTER — Ambulatory Visit (HOSPITAL_COMMUNITY)
Admission: RE | Admit: 2022-02-15 | Discharge: 2022-02-15 | Disposition: A | Discharge status: Home / Self Care | Payer: Medicare Other | Source: Ambulatory Visit | Attending: Internal Medicine | Admitting: Internal Medicine

## 2022-02-15 DIAGNOSIS — Z1231 Encounter for screening mammogram for malignant neoplasm of breast: Secondary | ICD-10-CM | POA: Diagnosis not present

## 2022-02-16 DIAGNOSIS — R945 Abnormal results of liver function studies: Secondary | ICD-10-CM | POA: Diagnosis not present

## 2022-02-16 DIAGNOSIS — F411 Generalized anxiety disorder: Secondary | ICD-10-CM | POA: Diagnosis not present

## 2022-02-16 DIAGNOSIS — D72829 Elevated white blood cell count, unspecified: Secondary | ICD-10-CM | POA: Diagnosis not present

## 2022-02-16 DIAGNOSIS — R140 Abdominal distension (gaseous): Secondary | ICD-10-CM | POA: Diagnosis not present

## 2022-03-16 DIAGNOSIS — F17200 Nicotine dependence, unspecified, uncomplicated: Secondary | ICD-10-CM | POA: Diagnosis not present

## 2022-03-16 DIAGNOSIS — F411 Generalized anxiety disorder: Secondary | ICD-10-CM | POA: Diagnosis not present

## 2022-03-16 DIAGNOSIS — D72829 Elevated white blood cell count, unspecified: Secondary | ICD-10-CM | POA: Diagnosis not present

## 2022-03-16 DIAGNOSIS — R140 Abdominal distension (gaseous): Secondary | ICD-10-CM | POA: Diagnosis not present

## 2022-07-06 DIAGNOSIS — H40013 Open angle with borderline findings, low risk, bilateral: Secondary | ICD-10-CM | POA: Diagnosis not present

## 2022-07-19 ENCOUNTER — Ambulatory Visit (HOSPITAL_COMMUNITY)
Admission: RE | Admit: 2022-07-19 | Discharge: 2022-07-19 | Disposition: A | Discharge status: Home / Self Care | Payer: Medicare Other | Source: Ambulatory Visit | Attending: Family Medicine | Admitting: Family Medicine

## 2022-07-19 ENCOUNTER — Other Ambulatory Visit (HOSPITAL_COMMUNITY): Payer: Self-pay | Admitting: Family Medicine

## 2022-07-19 DIAGNOSIS — F411 Generalized anxiety disorder: Secondary | ICD-10-CM | POA: Diagnosis not present

## 2022-07-19 DIAGNOSIS — R059 Cough, unspecified: Secondary | ICD-10-CM | POA: Insufficient documentation

## 2022-07-19 DIAGNOSIS — G4700 Insomnia, unspecified: Secondary | ICD-10-CM | POA: Diagnosis not present

## 2022-07-19 DIAGNOSIS — J019 Acute sinusitis, unspecified: Secondary | ICD-10-CM | POA: Diagnosis not present

## 2022-07-19 DIAGNOSIS — R0602 Shortness of breath: Secondary | ICD-10-CM | POA: Diagnosis not present

## 2022-08-11 DIAGNOSIS — R7303 Prediabetes: Secondary | ICD-10-CM | POA: Diagnosis not present

## 2022-08-11 DIAGNOSIS — E782 Mixed hyperlipidemia: Secondary | ICD-10-CM | POA: Diagnosis not present

## 2022-08-17 DIAGNOSIS — R55 Syncope and collapse: Secondary | ICD-10-CM | POA: Diagnosis not present

## 2022-08-17 DIAGNOSIS — F411 Generalized anxiety disorder: Secondary | ICD-10-CM | POA: Diagnosis not present

## 2022-08-17 DIAGNOSIS — E782 Mixed hyperlipidemia: Secondary | ICD-10-CM | POA: Diagnosis not present

## 2022-08-17 DIAGNOSIS — K219 Gastro-esophageal reflux disease without esophagitis: Secondary | ICD-10-CM | POA: Diagnosis not present

## 2022-08-17 DIAGNOSIS — R7303 Prediabetes: Secondary | ICD-10-CM | POA: Diagnosis not present

## 2022-08-17 DIAGNOSIS — D72829 Elevated white blood cell count, unspecified: Secondary | ICD-10-CM | POA: Diagnosis not present

## 2022-12-11 DIAGNOSIS — R7303 Prediabetes: Secondary | ICD-10-CM | POA: Diagnosis not present

## 2022-12-11 DIAGNOSIS — E782 Mixed hyperlipidemia: Secondary | ICD-10-CM | POA: Diagnosis not present

## 2022-12-14 ENCOUNTER — Other Ambulatory Visit (HOSPITAL_COMMUNITY): Payer: Self-pay | Admitting: Family Medicine

## 2022-12-14 DIAGNOSIS — R7303 Prediabetes: Secondary | ICD-10-CM | POA: Diagnosis not present

## 2022-12-14 DIAGNOSIS — E782 Mixed hyperlipidemia: Secondary | ICD-10-CM | POA: Diagnosis not present

## 2022-12-14 DIAGNOSIS — D72829 Elevated white blood cell count, unspecified: Secondary | ICD-10-CM | POA: Diagnosis not present

## 2022-12-14 DIAGNOSIS — F17200 Nicotine dependence, unspecified, uncomplicated: Secondary | ICD-10-CM

## 2022-12-14 DIAGNOSIS — F411 Generalized anxiety disorder: Secondary | ICD-10-CM | POA: Diagnosis not present

## 2023-01-29 ENCOUNTER — Encounter (HOSPITAL_COMMUNITY): Payer: Self-pay

## 2023-01-29 ENCOUNTER — Ambulatory Visit (HOSPITAL_COMMUNITY): Payer: Medicare Other

## 2023-04-05 DIAGNOSIS — J441 Chronic obstructive pulmonary disease with (acute) exacerbation: Secondary | ICD-10-CM | POA: Diagnosis not present

## 2023-04-05 DIAGNOSIS — J019 Acute sinusitis, unspecified: Secondary | ICD-10-CM | POA: Diagnosis not present

## 2023-04-05 DIAGNOSIS — R059 Cough, unspecified: Secondary | ICD-10-CM | POA: Diagnosis not present

## 2023-07-11 DIAGNOSIS — R7303 Prediabetes: Secondary | ICD-10-CM | POA: Diagnosis not present

## 2023-07-11 DIAGNOSIS — E782 Mixed hyperlipidemia: Secondary | ICD-10-CM | POA: Diagnosis not present

## 2023-07-19 ENCOUNTER — Other Ambulatory Visit (HOSPITAL_COMMUNITY): Payer: Self-pay | Admitting: Internal Medicine

## 2023-07-19 DIAGNOSIS — D72829 Elevated white blood cell count, unspecified: Secondary | ICD-10-CM | POA: Diagnosis not present

## 2023-07-19 DIAGNOSIS — F17210 Nicotine dependence, cigarettes, uncomplicated: Secondary | ICD-10-CM

## 2023-07-19 DIAGNOSIS — E782 Mixed hyperlipidemia: Secondary | ICD-10-CM | POA: Diagnosis not present

## 2023-07-19 DIAGNOSIS — F5105 Insomnia due to other mental disorder: Secondary | ICD-10-CM | POA: Diagnosis not present

## 2023-07-19 DIAGNOSIS — F411 Generalized anxiety disorder: Secondary | ICD-10-CM | POA: Diagnosis not present

## 2023-07-19 DIAGNOSIS — R7303 Prediabetes: Secondary | ICD-10-CM | POA: Diagnosis not present

## 2023-07-30 ENCOUNTER — Other Ambulatory Visit (HOSPITAL_COMMUNITY): Payer: Self-pay | Admitting: Respiratory Therapy

## 2023-07-30 DIAGNOSIS — J449 Chronic obstructive pulmonary disease, unspecified: Secondary | ICD-10-CM

## 2023-08-23 DIAGNOSIS — Z79899 Other long term (current) drug therapy: Secondary | ICD-10-CM | POA: Diagnosis not present

## 2023-08-23 DIAGNOSIS — F411 Generalized anxiety disorder: Secondary | ICD-10-CM | POA: Diagnosis not present

## 2023-08-23 DIAGNOSIS — F5105 Insomnia due to other mental disorder: Secondary | ICD-10-CM | POA: Diagnosis not present

## 2023-08-23 DIAGNOSIS — G4700 Insomnia, unspecified: Secondary | ICD-10-CM | POA: Diagnosis not present

## 2023-09-05 ENCOUNTER — Ambulatory Visit (HOSPITAL_COMMUNITY): Payer: Medicare Other

## 2023-09-05 ENCOUNTER — Encounter (HOSPITAL_COMMUNITY): Payer: Self-pay

## 2023-10-11 DIAGNOSIS — F411 Generalized anxiety disorder: Secondary | ICD-10-CM | POA: Diagnosis not present

## 2023-10-11 DIAGNOSIS — R053 Chronic cough: Secondary | ICD-10-CM | POA: Diagnosis not present

## 2023-10-11 DIAGNOSIS — J441 Chronic obstructive pulmonary disease with (acute) exacerbation: Secondary | ICD-10-CM | POA: Diagnosis not present

## 2023-10-12 ENCOUNTER — Ambulatory Visit (HOSPITAL_COMMUNITY)
Admission: RE | Admit: 2023-10-12 | Discharge: 2023-10-12 | Disposition: A | Discharge status: Home / Self Care | Source: Ambulatory Visit | Attending: Internal Medicine | Admitting: Internal Medicine

## 2023-10-12 DIAGNOSIS — F17210 Nicotine dependence, cigarettes, uncomplicated: Secondary | ICD-10-CM | POA: Insufficient documentation

## 2023-12-12 DIAGNOSIS — E782 Mixed hyperlipidemia: Secondary | ICD-10-CM | POA: Diagnosis not present

## 2023-12-12 DIAGNOSIS — R7303 Prediabetes: Secondary | ICD-10-CM | POA: Diagnosis not present

## 2023-12-17 ENCOUNTER — Other Ambulatory Visit (HOSPITAL_COMMUNITY): Payer: Self-pay | Admitting: Internal Medicine

## 2023-12-17 DIAGNOSIS — Z13820 Encounter for screening for osteoporosis: Secondary | ICD-10-CM

## 2023-12-17 DIAGNOSIS — Z1231 Encounter for screening mammogram for malignant neoplasm of breast: Secondary | ICD-10-CM

## 2023-12-17 DIAGNOSIS — E782 Mixed hyperlipidemia: Secondary | ICD-10-CM | POA: Diagnosis not present

## 2023-12-17 DIAGNOSIS — F411 Generalized anxiety disorder: Secondary | ICD-10-CM | POA: Diagnosis not present

## 2023-12-17 DIAGNOSIS — R7303 Prediabetes: Secondary | ICD-10-CM | POA: Diagnosis not present

## 2023-12-17 DIAGNOSIS — R252 Cramp and spasm: Secondary | ICD-10-CM | POA: Diagnosis not present

## 2023-12-17 DIAGNOSIS — Z0000 Encounter for general adult medical examination without abnormal findings: Secondary | ICD-10-CM | POA: Diagnosis not present

## 2023-12-17 DIAGNOSIS — G4700 Insomnia, unspecified: Secondary | ICD-10-CM | POA: Diagnosis not present

## 2023-12-17 DIAGNOSIS — Z23 Encounter for immunization: Secondary | ICD-10-CM | POA: Diagnosis not present

## 2023-12-17 DIAGNOSIS — R600 Localized edema: Secondary | ICD-10-CM | POA: Diagnosis not present

## 2023-12-26 ENCOUNTER — Other Ambulatory Visit (HOSPITAL_COMMUNITY)

## 2023-12-26 ENCOUNTER — Inpatient Hospital Stay (HOSPITAL_COMMUNITY): Admission: RE | Admit: 2023-12-26 | Source: Ambulatory Visit

## 2024-02-01 ENCOUNTER — Ambulatory Visit (HOSPITAL_COMMUNITY)
Admission: RE | Admit: 2024-02-01 | Discharge: 2024-02-01 | Disposition: A | Discharge status: Home / Self Care | Source: Ambulatory Visit | Attending: Internal Medicine | Admitting: Internal Medicine

## 2024-02-01 DIAGNOSIS — M810 Age-related osteoporosis without current pathological fracture: Secondary | ICD-10-CM | POA: Diagnosis not present

## 2024-02-01 DIAGNOSIS — Z1231 Encounter for screening mammogram for malignant neoplasm of breast: Secondary | ICD-10-CM | POA: Insufficient documentation

## 2024-02-01 DIAGNOSIS — Z780 Asymptomatic menopausal state: Secondary | ICD-10-CM | POA: Diagnosis not present

## 2024-02-01 DIAGNOSIS — Z13820 Encounter for screening for osteoporosis: Secondary | ICD-10-CM | POA: Diagnosis not present

## 2024-02-15 ENCOUNTER — Other Ambulatory Visit (HOSPITAL_COMMUNITY): Payer: Self-pay | Admitting: Internal Medicine

## 2024-02-15 DIAGNOSIS — M810 Age-related osteoporosis without current pathological fracture: Secondary | ICD-10-CM | POA: Insufficient documentation

## 2024-02-18 ENCOUNTER — Telehealth: Payer: Self-pay

## 2024-02-18 NOTE — Telephone Encounter (Signed)
 Auth Submission: APPROVED Site of care: Site of care: [facility] Payer: [employer] [place] Medication & CPT/J Code(s) submitted: Prolia  (Denosumab ) [id number] Diagnosis Code:  Route of submission (phone, fax, portal): portal Phone # Fax # Auth type: Buy/Bill PB Units/visits requested: 60mg , q67months x 2 doses Reference number: [id number] Approval from: 02/18/24 to 02/17/25

## 2024-02-19 ENCOUNTER — Encounter: Attending: Internal Medicine | Admitting: *Deleted

## 2024-02-19 VITALS — BP 109/71 | HR 70 | Temp 98.2°F | Resp 18

## 2024-02-19 DIAGNOSIS — M810 Age-related osteoporosis without current pathological fracture: Secondary | ICD-10-CM

## 2024-02-19 MED ORDER — DENOSUMAB 60 MG/ML ~~LOC~~ SOSY
60.0000 mg | PREFILLED_SYRINGE | Freq: Once | SUBCUTANEOUS | Status: AC
  Administered 2024-02-19: 60 mg via SUBCUTANEOUS

## 2024-02-19 NOTE — Progress Notes (Signed)
 Diagnosis: Osteoporosis  Provider:  Dwana Melena MD  Procedure: Injection  Prolia (Denosumab), Dose: 60 mg, Site: subcutaneous, Number of injections: 1  Injection Site(s): Left arm  Post Care: Observation period completed  Discharge: Condition: Good, Destination: Home . AVS Provided  Performed by:  Daleen Squibb, RN

## 2024-03-18 DIAGNOSIS — F411 Generalized anxiety disorder: Secondary | ICD-10-CM | POA: Diagnosis not present

## 2024-03-18 DIAGNOSIS — G4700 Insomnia, unspecified: Secondary | ICD-10-CM | POA: Diagnosis not present

## 2024-03-18 DIAGNOSIS — F17210 Nicotine dependence, cigarettes, uncomplicated: Secondary | ICD-10-CM | POA: Diagnosis not present

## 2024-03-18 DIAGNOSIS — M810 Age-related osteoporosis without current pathological fracture: Secondary | ICD-10-CM | POA: Diagnosis not present

## 2024-03-18 DIAGNOSIS — J449 Chronic obstructive pulmonary disease, unspecified: Secondary | ICD-10-CM | POA: Diagnosis not present

## 2024-03-18 DIAGNOSIS — J069 Acute upper respiratory infection, unspecified: Secondary | ICD-10-CM | POA: Diagnosis not present

## 2024-03-18 DIAGNOSIS — F5105 Insomnia due to other mental disorder: Secondary | ICD-10-CM | POA: Diagnosis not present

## 2024-05-09 DIAGNOSIS — Z1211 Encounter for screening for malignant neoplasm of colon: Secondary | ICD-10-CM | POA: Diagnosis not present

## 2024-05-20 LAB — COLOGUARD: COLOGUARD: NEGATIVE

## 2024-08-20 ENCOUNTER — Ambulatory Visit

## 2025-02-18 ENCOUNTER — Ambulatory Visit
